# Patient Record
Sex: Female | Born: 1960 | Race: White | Hispanic: No | Marital: Married | Smoking: Never smoker
Health system: Southern US, Community
[De-identification: ages and names within clinical notes are randomized; demographics above are authoritative.]

## PROBLEM LIST (undated history)

## (undated) DIAGNOSIS — R87629 Unspecified abnormal cytological findings in specimens from vagina: Secondary | ICD-10-CM

## (undated) HISTORY — PX: OTHER SURGICAL HISTORY: SHX169

## (undated) HISTORY — DX: Unspecified abnormal cytological findings in specimens from vagina: R87.629

---

## 2004-07-04 ENCOUNTER — Ambulatory Visit: Payer: Self-pay | Admitting: Obstetrics and Gynecology

## 2005-09-10 ENCOUNTER — Ambulatory Visit: Payer: Self-pay | Admitting: Obstetrics and Gynecology

## 2005-09-21 ENCOUNTER — Ambulatory Visit: Payer: Self-pay | Admitting: Obstetrics and Gynecology

## 2006-03-29 ENCOUNTER — Ambulatory Visit: Payer: Self-pay | Admitting: Obstetrics and Gynecology

## 2006-09-13 ENCOUNTER — Ambulatory Visit: Payer: Self-pay | Admitting: Obstetrics and Gynecology

## 2007-12-29 ENCOUNTER — Ambulatory Visit: Payer: Self-pay | Admitting: Obstetrics and Gynecology

## 2009-01-03 ENCOUNTER — Ambulatory Visit: Payer: Self-pay | Admitting: Unknown Physician Specialty

## 2009-01-08 ENCOUNTER — Ambulatory Visit: Payer: Self-pay | Admitting: Unknown Physician Specialty

## 2009-08-10 HISTORY — PX: LAPAROSCOPY: SHX197

## 2009-09-05 ENCOUNTER — Ambulatory Visit: Payer: Self-pay | Admitting: Internal Medicine

## 2010-01-24 ENCOUNTER — Ambulatory Visit: Payer: Self-pay | Admitting: Unknown Physician Specialty

## 2010-06-22 IMAGING — CT CT ABD-PELV W/ CM
1 of 3 series · 12 of 32 positions shown, 18 images · non-contrast
Comparison: none

REASON FOR EXAM: progressive weight loss
COMMENTS:

[Series 2: soft tissue · axial · 0.58mm/px · z∈[-1224,-864]mm · 12 of 86 slices shown, 18 images]
[im 7/86  soft-tissue]
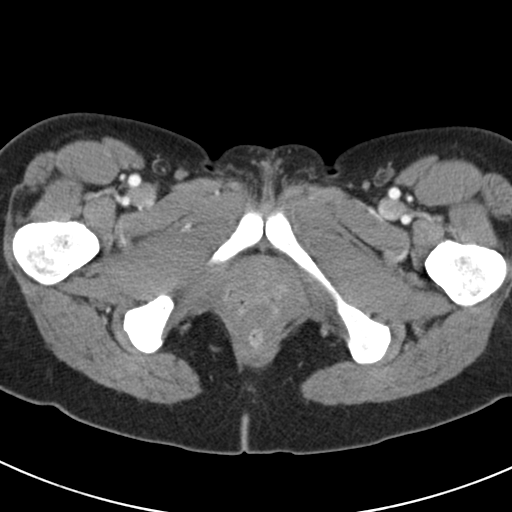
[im 7/86  bone]
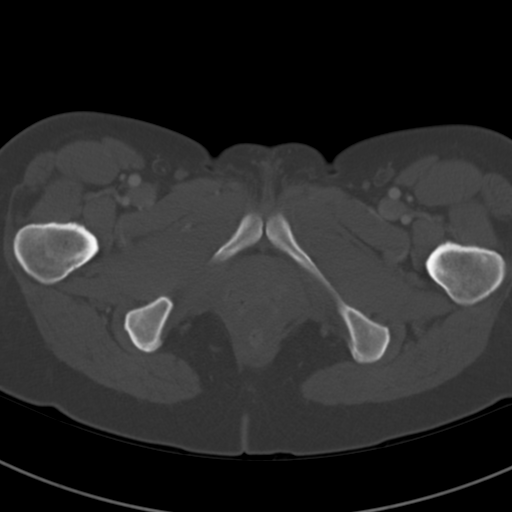
[im 13/86  soft-tissue]
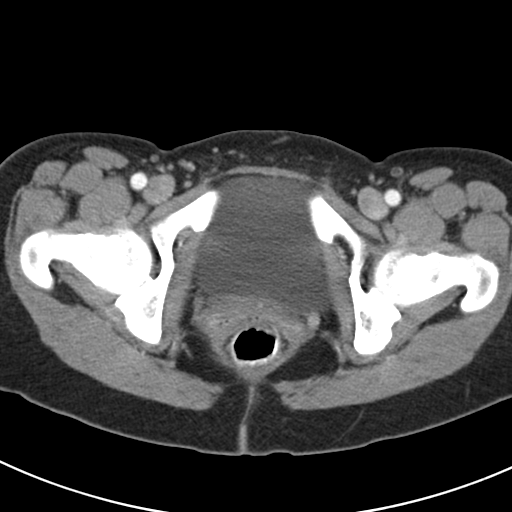
[im 19/86  soft-tissue]
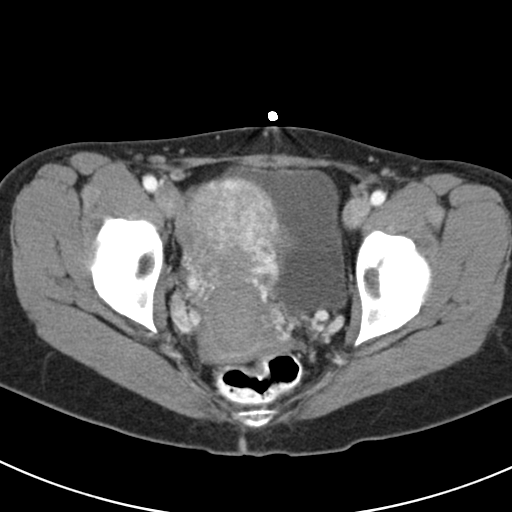
[im 25/86  soft-tissue]
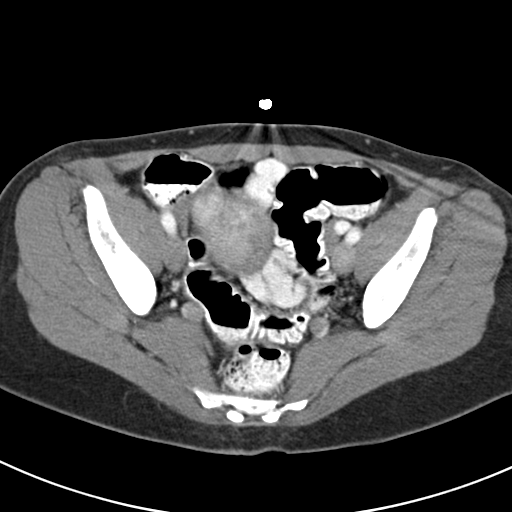
[im 31/86  soft-tissue]
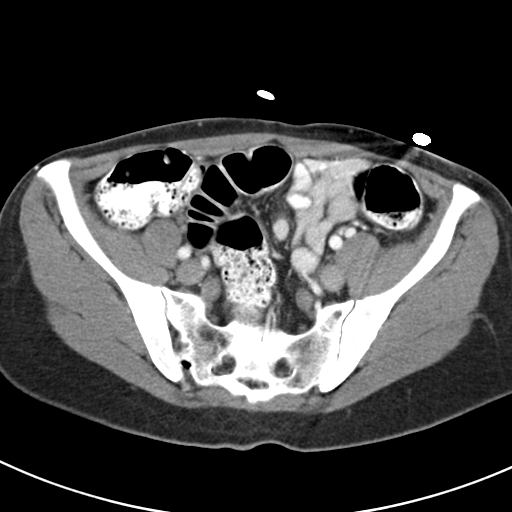
[im 37/86  soft-tissue]
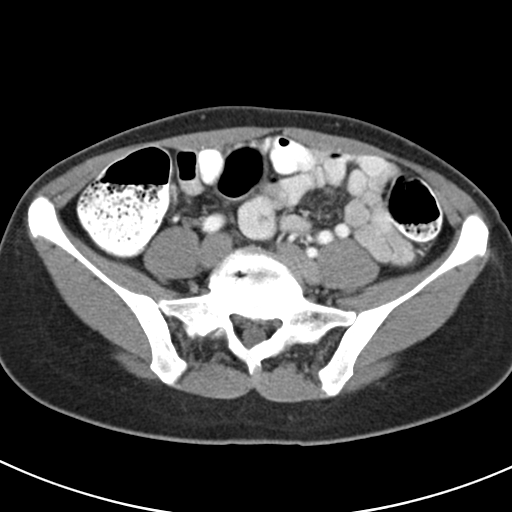
[im 49/86  soft-tissue]
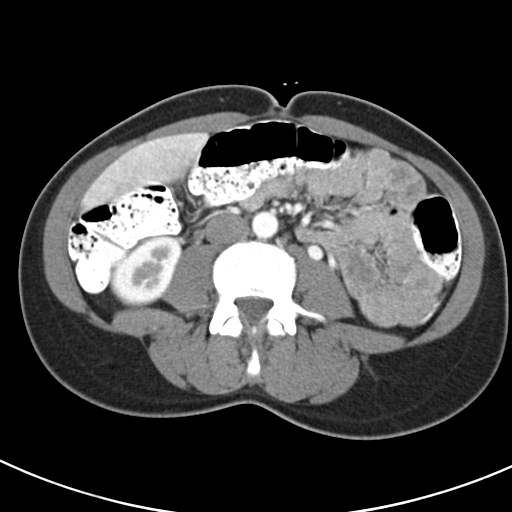
[im 55/86  soft-tissue]
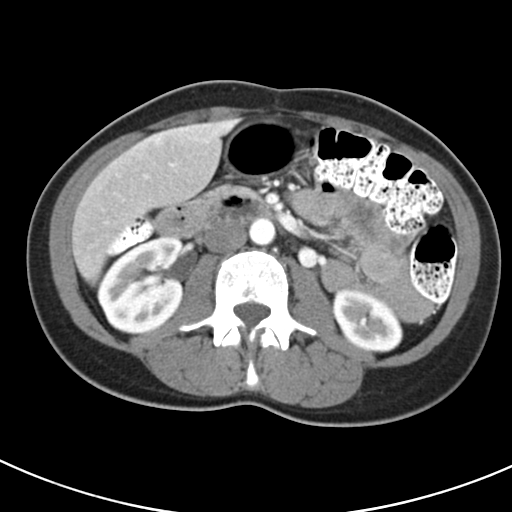
[im 61/86  soft-tissue]
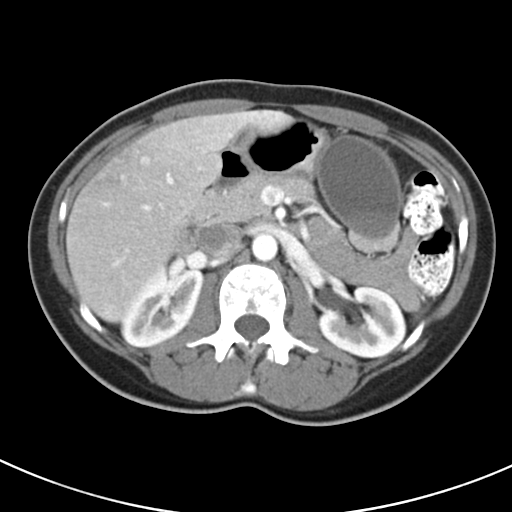
[im 61/86  lung]
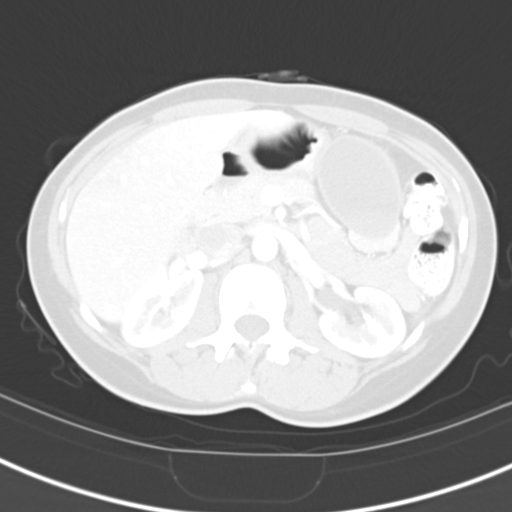
[im 61/86  bone]
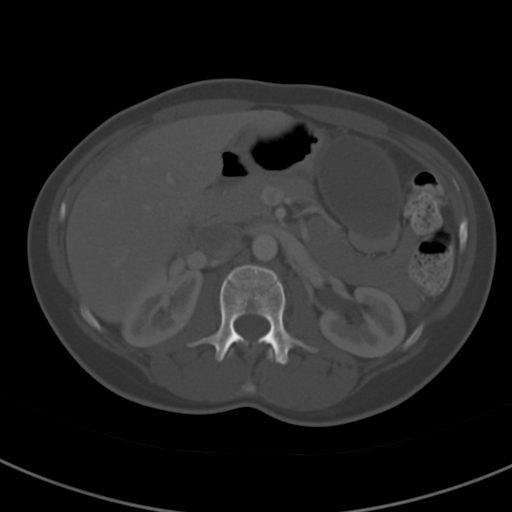
[im 67/86  soft-tissue]
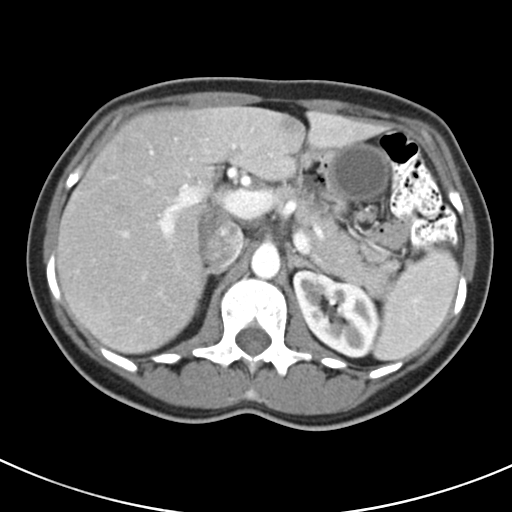
[im 67/86  lung]
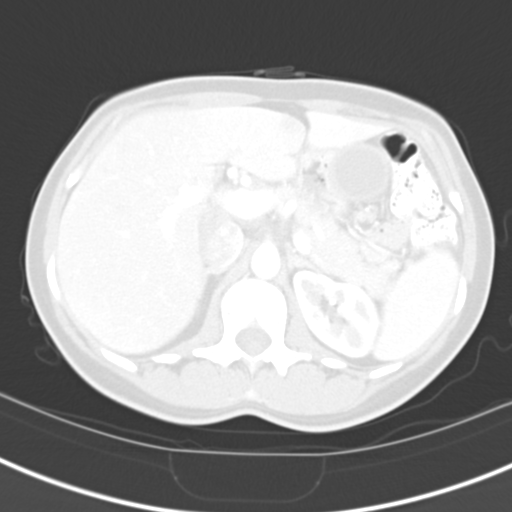
[im 73/86  soft-tissue]
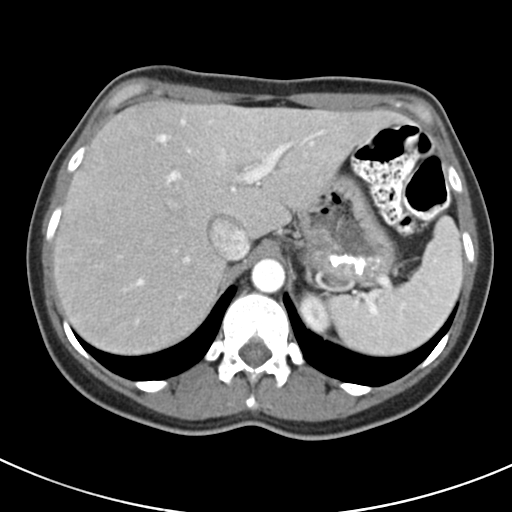
[im 73/86  lung]
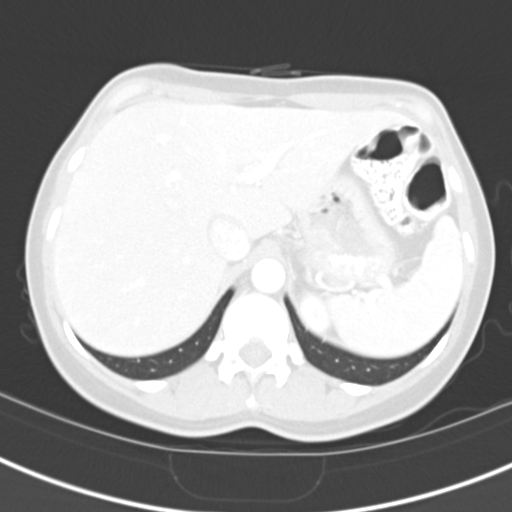
[im 79/86  soft-tissue]
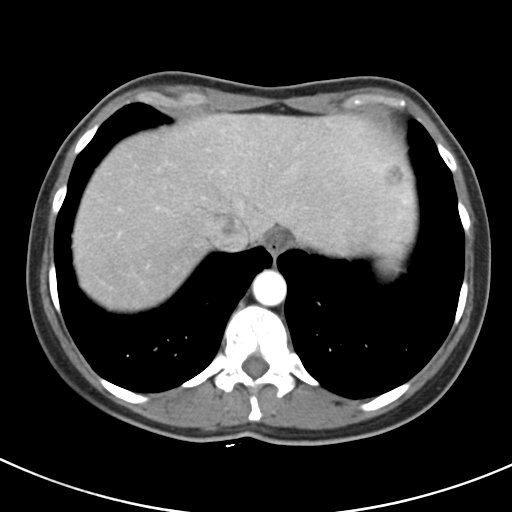
[im 79/86  lung]
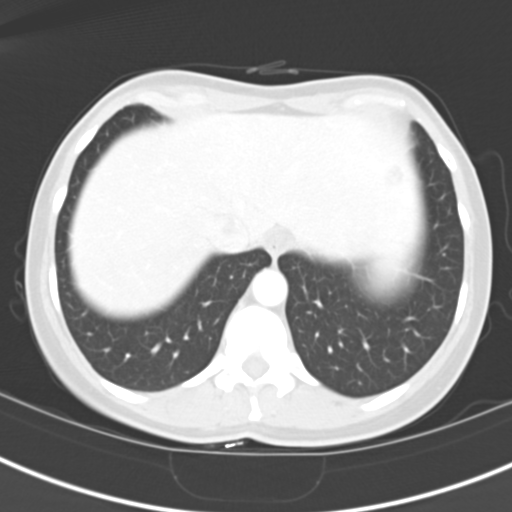

[12 of 32 positions shown; findings below may reference images not displayed]

PROCEDURE:     CT  - CT ABDOMEN / PELVIS  W  - September 05, 2009  [DATE]

RESULT:     CT of the abdomen and pelvis is performed utilizing 100 ml of
Zsovue-MN2 iodinated intravenous contrast along with oral contrast. Images
are reconstructed in the axial plane at 5 mm slice thickness. Axial, coronal
and sagittal thin slice reconstructions were performed utilizing the
[employer] software at the time of dictation. The patient has no previous CT
for comparison.

Images through the base of the lungs demonstrate no definite nodule or mass.
There is no infiltrate, effusion or significant atelectasis. There is a
cystic mass in the left anterior abdomen demonstrated from image #18 to
image #30. This contains some debris and has an enhancing relatively thick
wall with a crescentic shaped enhancing solid tissue component posteriorly.
The cystic mass overall shows a longitudinal measurement of 7.28 cm with a
transverse measurement up to 4.46 cm on image #26. The enhancing soft tissue
crescentic component measures up to 2.86 cm x 0.70 cm on image #26.

There is an area of decreased attenuation and enhancement in the left lobe
of the liver on image #8 an image #9 measuring up to 1.34 cm with a second
similar lesion in the right lobe of the liver best seen on image #12
measuring up to 11 mm. Delayed images were obtained through the liver and
these areas appear to be isodense with the hepatic parenchyma suggesting
that they may be hemangiomas.

There is fullness in the cervical region. Correlate with pelvic exam and Pap
smear. The endometrium appears to be thickened. Pelvic ultrasound is
recommended. There does not appear to be evidence of bowel obstruction,
bowel wall thickening or adenopathy. There is no significant ascites. A
trace amount of fluid is seen in the pelvis. The urinary bladder is
unremarkable. The aorta is normal in caliber. The kidneys enhance
symmetrically and homogeneously without obstruction. The spleen, pancreas
and bony structures appear unremarkable.

The cystic mass with debris and an enhancing wall and possibly an associated
soft tissue component do not appear to be originating from the pancreas even
though they are in close proximity to the pancreatic tail region. A definite
origin from a loop of bowel cannot be established on these images despite
the use of multiplanar reconstructions.
IMPRESSION: 1. Large cystic mass with a solid component in the left anterior to mid
abdomen. Differential considerations are for a cystic gastrointestinal
stromal tumor, cystic mesothelioma, cystic teratoma, cystic lymphangioma,
cystic synovial sarcoma, GI duplication cyst or a pancreatic pseudocyst.
Surgical consultation for excision is suggested.  PET/CT is suggested prior
to any surgical intervention.
2. Lesions within the hepatic parenchyma which are nonspecific. The
appearance suggests these may be secondary to hemangiomas but MRI of the
liver is recommended for further assessment.
3. Fullness of the endometrial lining and of the cervical region. Correlate
with pelvic exam, Pap smear and pelvic ultrasound.

## 2011-03-17 ENCOUNTER — Ambulatory Visit: Payer: Self-pay | Admitting: Unknown Physician Specialty

## 2011-03-23 ENCOUNTER — Ambulatory Visit: Payer: Self-pay | Admitting: Unknown Physician Specialty

## 2011-06-26 ENCOUNTER — Ambulatory Visit: Payer: Self-pay | Admitting: Unknown Physician Specialty

## 2011-06-30 LAB — PATHOLOGY REPORT

## 2011-09-24 ENCOUNTER — Ambulatory Visit: Payer: Self-pay | Admitting: General Surgery

## 2012-05-30 ENCOUNTER — Ambulatory Visit: Payer: Self-pay | Admitting: Obstetrics and Gynecology

## 2013-06-02 ENCOUNTER — Ambulatory Visit: Payer: Self-pay | Admitting: Obstetrics and Gynecology

## 2014-06-05 ENCOUNTER — Ambulatory Visit: Payer: Self-pay | Admitting: Obstetrics and Gynecology

## 2015-03-14 ENCOUNTER — Encounter: Payer: Self-pay | Admitting: *Deleted

## 2015-03-19 ENCOUNTER — Encounter: Payer: Self-pay | Admitting: Obstetrics and Gynecology

## 2015-03-19 ENCOUNTER — Ambulatory Visit (INDEPENDENT_AMBULATORY_CARE_PROVIDER_SITE_OTHER): Payer: PRIVATE HEALTH INSURANCE | Admitting: Obstetrics and Gynecology

## 2015-03-19 VITALS — BP 122/61 | HR 69 | Ht 66.0 in | Wt 129.0 lb

## 2015-03-19 DIAGNOSIS — E559 Vitamin D deficiency, unspecified: Secondary | ICD-10-CM | POA: Diagnosis not present

## 2015-03-19 DIAGNOSIS — Z01419 Encounter for gynecological examination (general) (routine) without abnormal findings: Secondary | ICD-10-CM | POA: Diagnosis not present

## 2015-03-19 NOTE — Progress Notes (Signed)
  Subjective:     Phyllis Barnett is a 54 y.o. female and is here for a comprehensive physical exam. The patient reports improvement of vaginal dryness with olive oil use, but having more difficulty with arousal..  History   Social History  . Marital Status: Married    Spouse Name: N/A  . Number of Children: N/A  . Years of Education: N/A   Occupational History  . Not on file.   Social History Main Topics  . Smoking status: Never Smoker   . Smokeless tobacco: Never Used  . Alcohol Use: Yes  . Drug Use: No  . Sexual Activity: Yes   Other Topics Concern  . Not on file   Social History Narrative   Health Maintenance  Topic Date Due  . Hepatitis C Screening  01-08-1961  . HIV Screening  04/01/1975  . PAP SMEAR  03/31/1978  . TETANUS/TDAP  04/01/1979  . MAMMOGRAM  03/31/2010  . COLONOSCOPY  03/31/2010  . INFLUENZA VACCINE  03/11/2015    The following portions of the patient's history were reviewed and updated as appropriate: allergies, current medications, past family history, past medical history, past social history, past surgical history and problem list.  Review of Systems A comprehensive review of systems was negative except for: Genitourinary: positive for sexual problems Behavioral/Psych: positive for feels like memory is not good   Objective:    General appearance: alert, cooperative and appears stated age Neck: no adenopathy, no carotid bruit, no JVD, supple, symmetrical, trachea midline and thyroid not enlarged, symmetric, no tenderness/mass/nodules Lungs: clear to auscultation bilaterally Breasts: normal appearance, no masses or tenderness Heart: regular rate and rhythm, S1, S2 normal, no murmur, click, rub or gallop Abdomen: soft, non-tender; bowel sounds normal; no masses,  no organomegaly Pelvic: cervix normal in appearance, external genitalia normal, no adnexal masses or tenderness, no cervical motion tenderness, rectovaginal septum normal, uterus  normal size, shape, and consistency and vagina normal without discharge    Assessment:    Healthy female exam. Postmenopausal sexual dysfunction; hypothyroidism; vitamin D deficiency      Plan:  Routine pap and labs obtained, added thyroid and vitamin d and progesterone levels,MMG ordered; will consider adding prometrium for sexual dysfunction if needed.  Will mail copy of labs.  RTC 1 year of prn   See After Visit Summary for Counseling Recommendations

## 2015-03-19 NOTE — Patient Instructions (Signed)
  Place annual gynecologic exam patient instructions here.  Thank you for enrolling in MyChart. Please follow the instructions below to securely access your online medical record. MyChart allows you to send messages to your doctor, view your test results, manage appointments, and more.   How Do I Sign Up? 1. In your Internet browser, go to [employer] and enter [email or web address]. 2. Click on the Sign Up Now link in the Sign In box. You will see the New Member Sign Up page. 3. Enter your MyChart Access Code exactly as it appears below. You will not need to use this code after you've completed the sign-up process. If you do not sign up before the expiration date, you must request a new code.  MyChart Access Code: [email or web address] Expires: 05/18/2015  8:52 AM  4. Enter your Social Security Number ([id number]) and Date of Birth (mm/dd/yyyy) as indicated and click Submit. You will be taken to the next sign-up page. 5. Create a MyChart ID. This will be your MyChart login ID and cannot be changed, so think of one that is secure and easy to remember. 6. Create a MyChart password. You can change your password at any time. 7. Enter your Password Reset Question and Answer. This can be used at a later time if you forget your password.  8. Enter your e-mail address. You will receive e-mail notification when new information is available in MyChart. 9. Click Sign Up. You can now view your medical record.   Additional Information Remember, MyChart is NOT to be used for urgent needs. For medical emergencies, dial 911.

## 2015-03-20 LAB — COMPREHENSIVE METABOLIC PANEL
ALT: 17 IU/L (ref 0–32)
AST: 21 IU/L (ref 0–40)
Albumin/Globulin Ratio: 1.8 (ref 1.1–2.5)
Albumin: 4.6 g/dL (ref 3.5–5.5)
Alkaline Phosphatase: 100 IU/L (ref 39–117)
BILIRUBIN TOTAL: 0.6 mg/dL (ref 0.0–1.2)
BUN/Creatinine Ratio: 13 (ref 9–23)
BUN: 11 mg/dL (ref 6–24)
CO2: 27 mmol/L (ref 18–29)
CREATININE: 0.83 mg/dL (ref 0.57–1.00)
Calcium: 9.7 mg/dL (ref 8.7–10.2)
Chloride: 100 mmol/L (ref 97–108)
GFR, EST AFRICAN AMERICAN: 92 mL/min/{1.73_m2} (ref >59)
GFR, EST NON AFRICAN AMERICAN: 80 mL/min/{1.73_m2} (ref >59)
Globulin, Total: 2.6 g/dL (ref 1.5–4.5)
Glucose: 87 mg/dL (ref 65–99)
Potassium: 4.3 mmol/L (ref 3.5–5.2)
SODIUM: 141 mmol/L (ref 134–144)
Total Protein: 7.2 g/dL (ref 6.0–8.5)

## 2015-03-20 LAB — THYROID PANEL WITH TSH
Free Thyroxine Index: 2.8 (ref 1.2–4.9)
T3 Uptake Ratio: 34 % (ref 24–39)
T4, Total: 8.3 ug/dL (ref 4.5–12.0)
TSH: 0.958 u[IU]/mL (ref 0.450–4.500)

## 2015-03-20 LAB — LIPID PANEL
Chol/HDL Ratio: 2.4 ratio units (ref 0.0–4.4)
Cholesterol, Total: 218 mg/dL — ABNORMAL HIGH (ref 100–199)
HDL: 91 mg/dL (ref >39)
LDL Calculated: 114 mg/dL — ABNORMAL HIGH (ref 0–99)
TRIGLYCERIDES: 63 mg/dL (ref 0–149)
VLDL Cholesterol Cal: 13 mg/dL (ref 5–40)

## 2015-03-20 LAB — PROGESTERONE: Progesterone: 0.2 ng/mL

## 2015-03-20 LAB — VITAMIN D 25 HYDROXY (VIT D DEFICIENCY, FRACTURES): Vit D, 25-Hydroxy: 52.2 ng/mL (ref 30.0–100.0)

## 2015-03-22 ENCOUNTER — Encounter: Payer: Self-pay | Admitting: Obstetrics and Gynecology

## 2015-03-26 ENCOUNTER — Other Ambulatory Visit: Payer: Self-pay | Admitting: Obstetrics and Gynecology

## 2015-03-26 MED ORDER — PROGESTERONE MICRONIZED 200 MG PO CAPS: 200.0000 mg | ORAL_CAPSULE | Freq: Every day | ORAL | Status: DC

## 2015-04-03 ENCOUNTER — Telehealth: Payer: Self-pay | Admitting: *Deleted

## 2015-04-03 NOTE — Telephone Encounter (Signed)
-----   Message from Ulyses Amor, [place] sent at 03/26/2015  3:02 PM EDT ----- Please let her know labs were normal except chol was a little elevated and progesterone levels are still low.  To continue to exercise regular and adhere to low chol diet and would recommend continuing on  progesterone supplements orally 3 weeks of each month.

## 2015-04-03 NOTE — Telephone Encounter (Signed)
Notified pt of results, she voiced understanding.

## 2015-05-14 ENCOUNTER — Telehealth: Payer: Self-pay | Admitting: Obstetrics and Gynecology

## 2015-05-14 NOTE — Telephone Encounter (Signed)
Yes, will still need her to drop off a urine sample

## 2015-05-14 NOTE — Telephone Encounter (Signed)
Pt needs to drop off ua correct???

## 2015-05-14 NOTE — Telephone Encounter (Signed)
Patient is sure she has a uti. She would like to know if Melody could just call something in for her.She uses [facility].Thanks

## 2015-05-15 ENCOUNTER — Other Ambulatory Visit (INDEPENDENT_AMBULATORY_CARE_PROVIDER_SITE_OTHER): Payer: PRIVATE HEALTH INSURANCE

## 2015-05-15 VITALS — BP 103/68 | HR 68 | Temp 98.3°F | Ht 66.0 in | Wt 128.4 lb

## 2015-05-15 DIAGNOSIS — N390 Urinary tract infection, site not specified: Secondary | ICD-10-CM | POA: Diagnosis not present

## 2015-05-15 LAB — POCT URINALYSIS DIPSTICK
BILIRUBIN UA: NEGATIVE
Glucose, UA: NEGATIVE
KETONES UA: NEGATIVE
Nitrite, UA: NEGATIVE
PH UA: 6
SPEC GRAV UA: 1.015
Urobilinogen, UA: NEGATIVE

## 2015-05-15 MED ORDER — NITROFURANTOIN MONOHYD MACRO 100 MG PO CAPS: 100.0000 mg | ORAL_CAPSULE | Freq: Two times a day (BID) | ORAL | Status: DC

## 2015-05-15 NOTE — Telephone Encounter (Signed)
Left message pt will need to drop off ua

## 2015-05-15 NOTE — Progress Notes (Signed)
Patient ID: Phyllis Barnett, female   DOB: February 05, 1961, 54 y.o.   MRN: [id number] Pt presents with UTI sx such as frequency, pain, chills. Took Azo last night and drank lots of water with some relief. Pt's urinalysis was abnormal here in the office  Urine culture sent to lab. MNB advised of urinalysis results. To send in rx for Macrobid  bid, x7days, and urogesic 4x day. Pt informed urine culture may be back Friday or Monday. To contact office Monday afternoon if she has not heard results.

## 2015-05-17 ENCOUNTER — Other Ambulatory Visit: Payer: Self-pay | Admitting: Obstetrics and Gynecology

## 2015-05-17 ENCOUNTER — Telehealth: Payer: Self-pay | Admitting: *Deleted

## 2015-05-17 LAB — URINE CULTURE

## 2015-05-17 NOTE — Telephone Encounter (Signed)
Left detailed message for pt 

## 2015-05-17 NOTE — Telephone Encounter (Signed)
-----   Message from Ulyses Amor, [place] sent at 05/17/2015  4:48 PM EDT ----- Please let her know urine culture was positive for e Coli and current antibiotic should work, make sure she is feeling better

## 2015-05-24 ENCOUNTER — Other Ambulatory Visit: Payer: Self-pay | Admitting: *Deleted

## 2015-05-24 MED ORDER — FLUCONAZOLE 100 MG PO TABS: 100.0000 mg | ORAL_TABLET | Freq: Every day | ORAL | Status: DC

## 2015-11-18 ENCOUNTER — Ambulatory Visit
Admission: RE | Admit: 2015-11-18 | Discharge: 2015-11-18 | Disposition: A | Discharge status: Home / Self Care | Payer: PRIVATE HEALTH INSURANCE | Source: Ambulatory Visit | Attending: Obstetrics and Gynecology | Admitting: Obstetrics and Gynecology

## 2015-11-18 DIAGNOSIS — Z1231 Encounter for screening mammogram for malignant neoplasm of breast: Secondary | ICD-10-CM | POA: Insufficient documentation

## 2015-11-18 DIAGNOSIS — Z01419 Encounter for gynecological examination (general) (routine) without abnormal findings: Secondary | ICD-10-CM

## 2016-02-25 ENCOUNTER — Encounter: Payer: Self-pay | Admitting: Obstetrics and Gynecology

## 2016-03-20 ENCOUNTER — Encounter: Payer: PRIVATE HEALTH INSURANCE | Admitting: Obstetrics and Gynecology

## 2016-04-28 ENCOUNTER — Other Ambulatory Visit: Payer: Self-pay | Admitting: *Deleted

## 2016-04-28 MED ORDER — PROGESTERONE MICRONIZED 200 MG PO CAPS: 200.0000 mg | ORAL_CAPSULE | Freq: Every day | ORAL | 4 refills | Status: DC

## 2016-05-04 ENCOUNTER — Other Ambulatory Visit: Payer: Self-pay | Admitting: *Deleted

## 2016-05-04 MED ORDER — FLUCONAZOLE 100 MG PO TABS: 100.0000 mg | ORAL_TABLET | Freq: Every day | ORAL | 1 refills | Status: DC

## 2016-05-14 ENCOUNTER — Encounter: Payer: Self-pay | Admitting: Obstetrics and Gynecology

## 2016-05-14 ENCOUNTER — Ambulatory Visit (INDEPENDENT_AMBULATORY_CARE_PROVIDER_SITE_OTHER): Payer: PRIVATE HEALTH INSURANCE | Admitting: Obstetrics and Gynecology

## 2016-05-14 VITALS — BP 117/75 | HR 75 | Ht 66.0 in | Wt 131.4 lb

## 2016-05-14 DIAGNOSIS — Z01419 Encounter for gynecological examination (general) (routine) without abnormal findings: Secondary | ICD-10-CM

## 2016-05-14 NOTE — Patient Instructions (Signed)
Preventive Care for Adults, Female A healthy lifestyle and preventive care can promote health and wellness. Preventive health guidelines for women include the following key practices.  A routine yearly physical is a good way to check with your health care provider about your health and preventive screening. It is a chance to share any concerns and updates on your health and to receive a thorough exam.  Visit your dentist for a routine exam and preventive care every 6 months. Brush your teeth twice a day and floss once a day. Good oral hygiene prevents tooth decay and gum disease.  The frequency of eye exams is based on your age, health, family medical history, use of contact lenses, and other factors. Follow your health care provider's recommendations for frequency of eye exams.  Eat a healthy diet. Foods like vegetables, fruits, whole grains, low-fat dairy products, and lean protein foods contain the nutrients you need without too many calories. Decrease your intake of foods high in solid fats, added sugars, and salt. Eat the right amount of calories for you.Get information about a proper diet from your health care provider, if necessary.  Regular physical exercise is one of the most important things you can do for your health. Most adults should get at least 150 minutes of moderate-intensity exercise (any activity that increases your heart rate and causes you to sweat) each week. In addition, most adults need muscle-strengthening exercises on 2 or more days a week.  Maintain a healthy weight. The body mass index (BMI) is a screening tool to identify possible weight problems. It provides an estimate of body fat based on height and weight. Your health care provider can find your BMI and can help you achieve or maintain a healthy weight.For adults 20 years and older:  A BMI below 18.5 is considered underweight.  A BMI of 18.5 to 24.9 is normal.  A BMI of 25 to 29.9 is considered  overweight.  A BMI of 30 and above is considered obese.  Maintain normal blood lipids and cholesterol levels by exercising and minimizing your intake of saturated fat. Eat a balanced diet with plenty of fruit and vegetables. Blood tests for lipids and cholesterol should begin at age 64 and be repeated every 5 years. If your lipid or cholesterol levels are high, you are over 50, or you are at high risk for heart disease, you may need your cholesterol levels checked more frequently.Ongoing high lipid and cholesterol levels should be treated with medicines if diet and exercise are not working.  If you smoke, find out from your health care provider how to quit. If you do not use tobacco, do not start.  Lung cancer screening is recommended for adults aged 52-80 years who are at high risk for developing lung cancer because of a history of smoking. A yearly low-dose CT scan of the lungs is recommended for people who have at least a 30-pack-year history of smoking and are a current smoker or have quit within the past 15 years. A pack year of smoking is smoking an average of 1 pack of cigarettes a day for 1 year (for example: 1 pack a day for 30 years or 2 packs a day for 15 years). Yearly screening should continue until the smoker has stopped smoking for at least 15 years. Yearly screening should be stopped for people who develop a health problem that would prevent them from having lung cancer treatment.  If you are pregnant, do not drink alcohol. If you are  breastfeeding, be very cautious about drinking alcohol. If you are not pregnant and choose to drink alcohol, do not have more than 1 drink per day. One drink is considered to be 12 ounces (355 mL) of beer, 5 ounces (148 mL) of wine, or 1.5 ounces (44 mL) of liquor.  Avoid use of street drugs. Do not share needles with anyone. Ask for help if you need support or instructions about stopping the use of drugs.  High blood pressure causes heart disease and  increases the risk of stroke. Your blood pressure should be checked at least every 1 to 2 years. Ongoing high blood pressure should be treated with medicines if weight loss and exercise do not work.  If you are 25-78 years old, ask your health care provider if you should take aspirin to prevent strokes.  Diabetes screening is done by taking a blood sample to check your blood glucose level after you have not eaten for a certain period of time (fasting). If you are not overweight and you do not have risk factors for diabetes, you should be screened once every 3 years starting at age 86. If you are overweight or obese and you are 3-87 years of age, you should be screened for diabetes every year as part of your cardiovascular risk assessment.  Breast cancer screening is essential preventive care for women. You should practice "breast self-awareness." This means understanding the normal appearance and feel of your breasts and may include breast self-examination. Any changes detected, no matter how small, should be reported to a health care provider. Women in their 66s and 30s should have a clinical breast exam (CBE) by a health care provider as part of a regular health exam every 1 to 3 years. After age 43, women should have a CBE every year. Starting at age 37, women should consider having a mammogram (breast X-ray test) every year. Women who have a family history of breast cancer should talk to their health care provider about genetic screening. Women at a high risk of breast cancer should talk to their health care providers about having an MRI and a mammogram every year.  Breast cancer gene (BRCA)-related cancer risk assessment is recommended for women who have family members with BRCA-related cancers. BRCA-related cancers include breast, ovarian, tubal, and peritoneal cancers. Having family members with these cancers may be associated with an increased risk for harmful changes (mutations) in the breast  cancer genes BRCA1 and BRCA2. Results of the assessment will determine the need for genetic counseling and BRCA1 and BRCA2 testing.  Your health care provider may recommend that you be screened regularly for cancer of the pelvic organs (ovaries, uterus, and vagina). This screening involves a pelvic examination, including checking for microscopic changes to the surface of your cervix (Pap test). You may be encouraged to have this screening done every 3 years, beginning at age 78.  For women ages 79-65, health care providers may recommend pelvic exams and Pap testing every 3 years, or they may recommend the Pap and pelvic exam, combined with testing for human papilloma virus (HPV), every 5 years. Some types of HPV increase your risk of cervical cancer. Testing for HPV may also be done on women of any age with unclear Pap test results.  Other health care providers may not recommend any screening for nonpregnant women who are considered low risk for pelvic cancer and who do not have symptoms. Ask your health care provider if a screening pelvic exam is right for  you.  If you have had past treatment for cervical cancer or a condition that could lead to cancer, you need Pap tests and screening for cancer for at least 20 years after your treatment. If Pap tests have been discontinued, your risk factors (such as having a new sexual partner) need to be reassessed to determine if screening should resume. Some women have medical problems that increase the chance of getting cervical cancer. In these cases, your health care provider may recommend more frequent screening and Pap tests.  Colorectal cancer can be detected and often prevented. Most routine colorectal cancer screening begins at the age of 50 years and continues through age 75 years. However, your health care provider may recommend screening at an earlier age if you have risk factors for colon cancer. On a yearly basis, your health care provider may provide  home test kits to check for hidden blood in the stool. Use of a small camera at the end of a tube, to directly examine the colon (sigmoidoscopy or colonoscopy), can detect the earliest forms of colorectal cancer. Talk to your health care provider about this at age 50, when routine screening begins. Direct exam of the colon should be repeated every 5-10 years through age 75 years, unless early forms of precancerous polyps or small growths are found.  People who are at an increased risk for hepatitis B should be screened for this virus. You are considered at high risk for hepatitis B if:  You were born in a country where hepatitis B occurs often. Talk with your health care provider about which countries are considered high risk.  Your parents were born in a high-risk country and you have not received a shot to protect against hepatitis B (hepatitis B vaccine).  You have HIV or AIDS.  You use needles to inject street drugs.  You live with, or have sex with, someone who has hepatitis B.  You get hemodialysis treatment.  You take certain medicines for conditions like cancer, organ transplantation, and autoimmune conditions.  Hepatitis C blood testing is recommended for all people born from 1945 through 1965 and any individual with known risks for hepatitis C.  Practice safe sex. Use condoms and avoid high-risk sexual practices to reduce the spread of sexually transmitted infections (STIs). STIs include gonorrhea, chlamydia, syphilis, trichomonas, herpes, HPV, and human immunodeficiency virus (HIV). Herpes, HIV, and HPV are viral illnesses that have no cure. They can result in disability, cancer, and death.  You should be screened for sexually transmitted illnesses (STIs) including gonorrhea and chlamydia if:  You are sexually active and are younger than 24 years.  You are older than 24 years and your health care provider tells you that you are at risk for this type of infection.  Your sexual  activity has changed since you were last screened and you are at an increased risk for chlamydia or gonorrhea. Ask your health care provider if you are at risk.  If you are at risk of being infected with HIV, it is recommended that you take a prescription medicine daily to prevent HIV infection. This is called preexposure prophylaxis (PrEP). You are considered at risk if:  You are sexually active and do not regularly use condoms or know the HIV status of your partner(s).  You take drugs by injection.  You are sexually active with a partner who has HIV.  Talk with your health care provider about whether you are at high risk of being infected with HIV. If   you choose to begin PrEP, you should first be tested for HIV. You should then be tested every 3 months for as long as you are taking PrEP.  Osteoporosis is a disease in which the bones lose minerals and strength with aging. This can result in serious bone fractures or breaks. The risk of osteoporosis can be identified using a bone density scan. Women ages 1 years and over and women at risk for fractures or osteoporosis should discuss screening with their health care providers. Ask your health care provider whether you should take a calcium supplement or vitamin D to reduce the rate of osteoporosis.  Menopause can be associated with physical symptoms and risks. Hormone replacement therapy is available to decrease symptoms and risks. You should talk to your health care provider about whether hormone replacement therapy is right for you.  Use sunscreen. Apply sunscreen liberally and repeatedly throughout the day. You should seek shade when your shadow is shorter than you. Protect yourself by wearing long sleeves, pants, a wide-brimmed hat, and sunglasses year round, whenever you are outdoors.  Once a month, do a whole body skin exam, using a mirror to look at the skin on your back. Tell your health care provider of new moles, moles that have irregular  borders, moles that are larger than a pencil eraser, or moles that have changed in shape or color.  Stay current with required vaccines (immunizations).  Influenza vaccine. All adults should be immunized every year.  Tetanus, diphtheria, and acellular pertussis (Td, Tdap) vaccine. Pregnant women should receive 1 dose of Tdap vaccine during each pregnancy. The dose should be obtained regardless of the length of time since the last dose. Immunization is preferred during the 27th-36th week of gestation. An adult who has not previously received Tdap or who does not know her vaccine status should receive 1 dose of Tdap. This initial dose should be followed by tetanus and diphtheria toxoids (Td) booster doses every 10 years. Adults with an unknown or incomplete history of completing a 3-dose immunization series with Td-containing vaccines should begin or complete a primary immunization series including a Tdap dose. Adults should receive a Td booster every 10 years.  Varicella vaccine. An adult without evidence of immunity to varicella should receive 2 doses or a second dose if she has previously received 1 dose. Pregnant females who do not have evidence of immunity should receive the first dose after pregnancy. This first dose should be obtained before leaving the health care facility. The second dose should be obtained 4-8 weeks after the first dose.  Human papillomavirus (HPV) vaccine. Females aged 13-26 years who have not received the vaccine previously should obtain the 3-dose series. The vaccine is not recommended for use in pregnant females. However, pregnancy testing is not needed before receiving a dose. If a female is found to be pregnant after receiving a dose, no treatment is needed. In that case, the remaining doses should be delayed until after the pregnancy. Immunization is recommended for any person with an immunocompromised condition through the age of 24 years if she did not get any or all doses  earlier. During the 3-dose series, the second dose should be obtained 4-8 weeks after the first dose. The third dose should be obtained 24 weeks after the first dose and 16 weeks after the second dose.  Zoster vaccine. One dose is recommended for adults aged 97 years or older unless certain conditions are present.  Measles, mumps, and rubella (MMR) vaccine. Adults born  before 1957 generally are considered immune to measles and mumps. Adults born in [birth year] or later should have 1 or more doses of MMR vaccine unless there is a contraindication to the vaccine or there is laboratory evidence of immunity to each of the three diseases. A routine second dose of MMR vaccine should be obtained at least 28 days after the first dose for students attending postsecondary schools, health care workers, or international travelers. People who received inactivated measles vaccine or an unknown type of measles vaccine during 1963-1967 should receive 2 doses of MMR vaccine. People who received inactivated mumps vaccine or an unknown type of mumps vaccine before 1979 and are at high risk for mumps infection should consider immunization with 2 doses of MMR vaccine. For females of childbearing age, rubella immunity should be determined. If there is no evidence of immunity, females who are not pregnant should be vaccinated. If there is no evidence of immunity, females who are pregnant should delay immunization until after pregnancy. Unvaccinated health care workers born before 60 who lack laboratory evidence of measles, mumps, or rubella immunity or laboratory confirmation of disease should consider measles and mumps immunization with 2 doses of MMR vaccine or rubella immunization with 1 dose of MMR vaccine.  Pneumococcal 13-valent conjugate (PCV13) vaccine. When indicated, a person who is uncertain of his immunization history and has no record of immunization should receive the PCV13 vaccine. All adults 61 years of age and older  should receive this vaccine. An adult aged 92 years or older who has certain medical conditions and has not been previously immunized should receive 1 dose of PCV13 vaccine. This PCV13 should be followed with a dose of pneumococcal polysaccharide (PPSV23) vaccine. Adults who are at high risk for pneumococcal disease should obtain the PPSV23 vaccine at least 8 weeks after the dose of PCV13 vaccine. Adults older than 56 years of age who have normal immune system function should obtain the PPSV23 vaccine dose at least 1 year after the dose of PCV13 vaccine.  Pneumococcal polysaccharide (PPSV23) vaccine. When PCV13 is also indicated, PCV13 should be obtained first. All adults aged 2 years and older should be immunized. An adult younger than age 30 years who has certain medical conditions should be immunized. Any person who resides in a nursing home or long-term care facility should be immunized. An adult smoker should be immunized. People with an immunocompromised condition and certain other conditions should receive both PCV13 and PPSV23 vaccines. People with human immunodeficiency virus (HIV) infection should be immunized as soon as possible after diagnosis. Immunization during chemotherapy or radiation therapy should be avoided. Routine use of PPSV23 vaccine is not recommended for American Indians, [place] Natives, or people younger than 65 years unless there are medical conditions that require PPSV23 vaccine. When indicated, people who have unknown immunization and have no record of immunization should receive PPSV23 vaccine. One-time revaccination 5 years after the first dose of PPSV23 is recommended for people aged 19-64 years who have chronic kidney failure, nephrotic syndrome, asplenia, or immunocompromised conditions. People who received 1-2 doses of PPSV23 before age 44 years should receive another dose of PPSV23 vaccine at age 83 years or later if at least 5 years have passed since the previous dose. Doses  of PPSV23 are not needed for people immunized with PPSV23 at or after age 20 years.  Meningococcal vaccine. Adults with asplenia or persistent complement component deficiencies should receive 2 doses of quadrivalent meningococcal conjugate (MenACWY-D) vaccine. The doses should be obtained  at least 2 months apart. Microbiologists working with certain meningococcal bacteria, [place] recruits, people at risk during an outbreak, and people who travel to or live in countries with a high rate of meningitis should be immunized. A first-year college student up through age 28 years who is living in a residence hall should receive a dose if she did not receive a dose on or after her 16th birthday. Adults who have certain high-risk conditions should receive one or more doses of vaccine.  Hepatitis A vaccine. Adults who wish to be protected from this disease, have certain high-risk conditions, work with hepatitis A-infected animals, work in hepatitis A research labs, or travel to or work in countries with a high rate of hepatitis A should be immunized. Adults who were previously unvaccinated and who anticipate close contact with an international adoptee during the first 60 days after arrival in the [place] from a country with a high rate of hepatitis A should be immunized.  Hepatitis B vaccine. Adults who wish to be protected from this disease, have certain high-risk conditions, may be exposed to blood or other infectious body fluids, are household contacts or sex partners of hepatitis B positive people, are clients or workers in certain care facilities, or travel to or work in countries with a high rate of hepatitis B should be immunized.  Haemophilus influenzae type b (Hib) vaccine. A previously unvaccinated person with asplenia or sickle cell disease or having a scheduled splenectomy should receive 1 dose of Hib vaccine. Regardless of previous immunization, a recipient of a hematopoietic stem cell transplant  should receive a 3-dose series 6-12 months after her successful transplant. Hib vaccine is not recommended for adults with HIV infection. Preventive Services / Frequency Ages 71 to 87 years  Blood pressure check.** / Every 3-5 years.  Lipid and cholesterol check.** / Every 5 years beginning at age 1.  Clinical breast exam.** / Every 3 years for women in their 3s and 31s.  BRCA-related cancer risk assessment.** / For women who have family members with a BRCA-related cancer (breast, ovarian, tubal, or peritoneal cancers).  Pap test.** / Every 2 years from ages 50 through 86. Every 3 years starting at age 87 through age 7 or 75 with a history of 3 consecutive normal Pap tests.  HPV screening.** / Every 3 years from ages 59 through ages 35 to 6 with a history of 3 consecutive normal Pap tests.  Hepatitis C blood test.** / For any individual with known risks for hepatitis C.  Skin self-exam. / Monthly.  Influenza vaccine. / Every year.  Tetanus, diphtheria, and acellular pertussis (Tdap, Td) vaccine.** / Consult your health care provider. Pregnant women should receive 1 dose of Tdap vaccine during each pregnancy. 1 dose of Td every 10 years.  Varicella vaccine.** / Consult your health care provider. Pregnant females who do not have evidence of immunity should receive the first dose after pregnancy.  HPV vaccine. / 3 doses over 6 months, if 72 and younger. The vaccine is not recommended for use in pregnant females. However, pregnancy testing is not needed before receiving a dose.  Measles, mumps, rubella (MMR) vaccine.** / You need at least 1 dose of MMR if you were born in 1957 or later. You may also need a 2nd dose. For females of childbearing age, rubella immunity should be determined. If there is no evidence of immunity, females who are not pregnant should be vaccinated. If there is no evidence of immunity, females who are  pregnant should delay immunization until after  pregnancy.  Pneumococcal 13-valent conjugate (PCV13) vaccine.** / Consult your health care provider.  Pneumococcal polysaccharide (PPSV23) vaccine.** / 1 to 2 doses if you smoke cigarettes or if you have certain conditions.  Meningococcal vaccine.** / 1 dose if you are age 87 to 44 years and a Market researcher living in a residence hall, or have one of several medical conditions, you need to get vaccinated against meningococcal disease. You may also need additional booster doses.  Hepatitis A vaccine.** / Consult your health care provider.  Hepatitis B vaccine.** / Consult your health care provider.  Haemophilus influenzae type b (Hib) vaccine.** / Consult your health care provider. Ages 86 to 38 years  Blood pressure check.** / Every year.  Lipid and cholesterol check.** / Every 5 years beginning at age 49 years.  Lung cancer screening. / Every year if you are aged 71-80 years and have a 30-pack-year history of smoking and currently smoke or have quit within the past 15 years. Yearly screening is stopped once you have quit smoking for at least 15 years or develop a health problem that would prevent you from having lung cancer treatment.  Clinical breast exam.** / Every year after age 51 years.  BRCA-related cancer risk assessment.** / For women who have family members with a BRCA-related cancer (breast, ovarian, tubal, or peritoneal cancers).  Mammogram.** / Every year beginning at age 18 years and continuing for as long as you are in good health. Consult with your health care provider.  Pap test.** / Every 3 years starting at age 63 years through age 37 or 57 years with a history of 3 consecutive normal Pap tests.  HPV screening.** / Every 3 years from ages 41 years through ages 76 to 23 years with a history of 3 consecutive normal Pap tests.  Fecal occult blood test (FOBT) of stool. / Every year beginning at age 36 years and continuing until age 51 years. You may not need  to do this test if you get a colonoscopy every 10 years.  Flexible sigmoidoscopy or colonoscopy.** / Every 5 years for a flexible sigmoidoscopy or every 10 years for a colonoscopy beginning at age 36 years and continuing until age 35 years.  Hepatitis C blood test.** / For all people born from [birth year] through 1965 and any individual with known risks for hepatitis C.  Skin self-exam. / Monthly.  Influenza vaccine. / Every year.  Tetanus, diphtheria, and acellular pertussis (Tdap/Td) vaccine.** / Consult your health care provider. Pregnant women should receive 1 dose of Tdap vaccine during each pregnancy. 1 dose of Td every 10 years.  Varicella vaccine.** / Consult your health care provider. Pregnant females who do not have evidence of immunity should receive the first dose after pregnancy.  Zoster vaccine.** / 1 dose for adults aged 73 years or older.  Measles, mumps, rubella (MMR) vaccine.** / You need at least 1 dose of MMR if you were born in 1957 or later. You may also need a second dose. For females of childbearing age, rubella immunity should be determined. If there is no evidence of immunity, females who are not pregnant should be vaccinated. If there is no evidence of immunity, females who are pregnant should delay immunization until after pregnancy.  Pneumococcal 13-valent conjugate (PCV13) vaccine.** / Consult your health care provider.  Pneumococcal polysaccharide (PPSV23) vaccine.** / 1 to 2 doses if you smoke cigarettes or if you have certain conditions.  Meningococcal vaccine.** /  Consult your health care provider.  Hepatitis A vaccine.** / Consult your health care provider.  Hepatitis B vaccine.** / Consult your health care provider.  Haemophilus influenzae type b (Hib) vaccine.** / Consult your health care provider. Ages 80 years and over  Blood pressure check.** / Every year.  Lipid and cholesterol check.** / Every 5 years beginning at age 62 years.  Lung cancer  screening. / Every year if you are aged 32-80 years and have a 30-pack-year history of smoking and currently smoke or have quit within the past 15 years. Yearly screening is stopped once you have quit smoking for at least 15 years or develop a health problem that would prevent you from having lung cancer treatment.  Clinical breast exam.** / Every year after age 61 years.  BRCA-related cancer risk assessment.** / For women who have family members with a BRCA-related cancer (breast, ovarian, tubal, or peritoneal cancers).  Mammogram.** / Every year beginning at age 39 years and continuing for as long as you are in good health. Consult with your health care provider.  Pap test.** / Every 3 years starting at age 85 years through age 74 or 72 years with 3 consecutive normal Pap tests. Testing can be stopped between 65 and 70 years with 3 consecutive normal Pap tests and no abnormal Pap or HPV tests in the past 10 years.  HPV screening.** / Every 3 years from ages 55 years through ages 67 or 77 years with a history of 3 consecutive normal Pap tests. Testing can be stopped between 65 and 70 years with 3 consecutive normal Pap tests and no abnormal Pap or HPV tests in the past 10 years.  Fecal occult blood test (FOBT) of stool. / Every year beginning at age 81 years and continuing until age 22 years. You may not need to do this test if you get a colonoscopy every 10 years.  Flexible sigmoidoscopy or colonoscopy.** / Every 5 years for a flexible sigmoidoscopy or every 10 years for a colonoscopy beginning at age 67 years and continuing until age 22 years.  Hepatitis C blood test.** / For all people born from [birth year] through 1966 and any individual with known risks for hepatitis C.  Osteoporosis screening.** / A one-time screening for women ages 8 years and over and women at risk for fractures or osteoporosis.  Skin self-exam. / Monthly.  Influenza vaccine. / Every year.  Tetanus, diphtheria, and  acellular pertussis (Tdap/Td) vaccine.** / 1 dose of Td every 10 years.  Varicella vaccine.** / Consult your health care provider.  Zoster vaccine.** / 1 dose for adults aged 56 years or older.  Pneumococcal 13-valent conjugate (PCV13) vaccine.** / Consult your health care provider.  Pneumococcal polysaccharide (PPSV23) vaccine.** / 1 dose for all adults aged 15 years and older.  Meningococcal vaccine.** / Consult your health care provider.  Hepatitis A vaccine.** / Consult your health care provider.  Hepatitis B vaccine.** / Consult your health care provider.  Haemophilus influenzae type b (Hib) vaccine.** / Consult your health care provider. ** Family history and personal history of risk and conditions may change your health care provider's recommendations.   This information is not intended to replace advice given to you by your health care provider. Make sure you discuss any questions you have with your health care provider.   Document Released: 09/22/2001 Document Revised: 08/17/2014 Document Reviewed: 12/22/2010 Elsevier Interactive Patient Education [employer].

## 2016-05-14 NOTE — Progress Notes (Signed)
Subjective:   Phyllis Barnett is a 55 y.o. 803P1003 Caucasian female here for a routine well-woman exam.  No LMP recorded. Patient is postmenopausal.    Current complaints: vaginal dryness helped with olive oil PCP: Bethann PunchesMark Miller       Doesn't need labs  Social History: Sexual: heterosexual Marital Status: married Living situation: with spouse Occupation: Runner, broadcasting/film/videoteacher at [employer] Tobacco/alcohol: no tobacco use Illicit drugs: no history of illicit drug use  The following portions of the patient's history were reviewed and updated as appropriate: allergies, current medications, past family history, past medical history, past social history, past surgical history and problem list.  Past Medical History Past Medical History:  Diagnosis Date  . Vaginal Pap smear, abnormal     Past Surgical History Past Surgical History:  Procedure Laterality Date  . LAPAROSCOPY  2011   benign tumor removed    Gynecologic History G3P1003  No LMP recorded. Patient is postmenopausal. Contraception: post menopausal status Last Pap: 2016. Results were: normal Last mammogram: 2017. Results were: normal  Obstetric History OB History  Gravida Para Term Preterm AB Living  3 1 1     3   SAB TAB Ectopic Multiple Live Births          3    # Outcome Date GA Lbr Len/2nd Weight Sex Delivery Anes PTL Lv  3 Gravida 1996    M Vag-Spont   LIV  2 Gravida 1992    M Vag-Spont   LIV  1 Term 1990    F Vag-Spont   LIV      Current Medications Current Outpatient Prescriptions on File Prior to Visit  Medication Sig Dispense Refill  . cholecalciferol (VITAMIN D) 1000 UNITS tablet Take 1,000 Units by mouth daily.    Marland Kitchen. levothyroxine (SYNTHROID, LEVOTHROID) 75 MCG tablet Take 75 mcg by mouth daily before breakfast.    . nitrofurantoin, macrocrystal-monohydrate, (MACROBID) 100 MG capsule Take 1 capsule (100 mg total) by mouth 2 (two) times daily. 14 capsule 0  . progesterone (PROMETRIUM) 200 MG capsule Take 1 capsule (200  mg total) by mouth daily. 30 capsule 4  . fluconazole (DIFLUCAN) 100 MG tablet Take 1 tablet (100 mg total) by mouth daily. (Patient not taking: Reported on 05/14/2016) 3 tablet 1   No current facility-administered medications on file prior to visit.     Review of Systems Patient denies any headaches, blurred vision, shortness of breath, chest pain, abdominal pain, problems with bowel movements, urination, or intercourse.  Objective:  BP 117/75   Pulse 75   Ht 5\' 6"  (1.676 m)   Wt 131 lb 6.4 oz (59.6 kg)   BMI 21.21 kg/m  Physical Exam  General:  Well developed, well nourished, no acute distress. She is alert and oriented x3. Skin:  Warm and dry Neck:  Midline trachea, no thyromegaly or nodules Cardiovascular: Regular rate and rhythm, no murmur heard Lungs:  Effort normal, all lung fields clear to auscultation bilaterally Breasts:  No dominant palpable mass, retraction, or nipple discharge Abdomen:  Soft, non tender, no hepatosplenomegaly or masses Pelvic:  External genitalia is normal in appearance.  The vagina is normal in appearance. The cervix is bulbous, no CMT.  Thin prep pap is not done . Uterus is felt to be normal size, shape, and contour.  No adnexal masses or tenderness noted. Extremities:  No swelling or varicosities noted Psych:  She has a normal mood and affect  Assessment:   Healthy well-woman exam HRT   Plan:  Will continue current meds F/U 1 year for AE, or sooner if needed Mammogram ordered Colonoscopy repeat due now- will call and schedule  Alpa Salvo Suzan Nailer, CNM

## 2016-10-22 ENCOUNTER — Other Ambulatory Visit: Payer: Self-pay | Admitting: Obstetrics and Gynecology

## 2016-11-18 ENCOUNTER — Ambulatory Visit: Payer: PRIVATE HEALTH INSURANCE

## 2016-12-01 ENCOUNTER — Ambulatory Visit
Admission: RE | Admit: 2016-12-01 | Discharge: 2016-12-01 | Disposition: A | Discharge status: Home / Self Care | Payer: BLUE CROSS/BLUE SHIELD | Source: Ambulatory Visit | Attending: Obstetrics and Gynecology | Admitting: Obstetrics and Gynecology

## 2016-12-01 DIAGNOSIS — Z1231 Encounter for screening mammogram for malignant neoplasm of breast: Secondary | ICD-10-CM | POA: Insufficient documentation

## 2016-12-01 DIAGNOSIS — Z01419 Encounter for gynecological examination (general) (routine) without abnormal findings: Secondary | ICD-10-CM

## 2017-03-09 ENCOUNTER — Other Ambulatory Visit: Payer: Self-pay | Admitting: Obstetrics and Gynecology

## 2017-05-12 ENCOUNTER — Ambulatory Visit: Payer: Self-pay

## 2017-05-12 DIAGNOSIS — Z0000 Encounter for general adult medical examination without abnormal findings: Secondary | ICD-10-CM

## 2017-05-13 LAB — CBC WITH DIFFERENTIAL/PLATELET
BASOS: 1 %
Basophils Absolute: 0 10*3/uL (ref 0.0–0.2)
EOS (ABSOLUTE): 0.2 10*3/uL (ref 0.0–0.4)
Eos: 4 %
Hematocrit: 39.9 % (ref 34.0–46.6)
Hemoglobin: 13.1 g/dL (ref 11.1–15.9)
Immature Grans (Abs): 0 10*3/uL (ref 0.0–0.1)
Immature Granulocytes: 0 %
LYMPHS: 40 %
Lymphocytes Absolute: 1.6 10*3/uL (ref 0.7–3.1)
MCH: 30.5 pg (ref 26.6–33.0)
MCHC: 32.8 g/dL (ref 31.5–35.7)
MCV: 93 fL (ref 79–97)
Monocytes Absolute: 0.3 10*3/uL (ref 0.1–0.9)
Monocytes: 7 %
Neutrophils Absolute: 2 10*3/uL (ref 1.4–7.0)
Neutrophils: 48 %
PLATELETS: 254 10*3/uL (ref 150–379)
RBC: 4.29 x10E6/uL (ref 3.77–5.28)
RDW: 12.4 % (ref 12.3–15.4)
WBC: 4 10*3/uL (ref 3.4–10.8)

## 2017-05-13 LAB — URINALYSIS, ROUTINE W REFLEX MICROSCOPIC
Bilirubin, UA: NEGATIVE
GLUCOSE, UA: NEGATIVE
KETONES UA: NEGATIVE
NITRITE UA: NEGATIVE
Protein, UA: NEGATIVE
RBC, UA: NEGATIVE
SPEC GRAV UA: 1.012 (ref 1.005–1.030)
UUROB: 0.2 mg/dL (ref 0.2–1.0)
pH, UA: 7.5 (ref 5.0–7.5)

## 2017-05-13 LAB — LIPID PANEL
CHOL/HDL RATIO: 2.3 ratio (ref 0.0–4.4)
Cholesterol, Total: 176 mg/dL (ref 100–199)
HDL: 78 mg/dL (ref >39)
LDL CALC: 80 mg/dL (ref 0–99)
Triglycerides: 89 mg/dL (ref 0–149)
VLDL Cholesterol Cal: 18 mg/dL (ref 5–40)

## 2017-05-13 LAB — COMPREHENSIVE METABOLIC PANEL
A/G RATIO: 1.9 (ref 1.2–2.2)
ALT: 13 IU/L (ref 0–32)
AST: 19 IU/L (ref 0–40)
Albumin: 4.5 g/dL (ref 3.5–5.5)
Alkaline Phosphatase: 88 IU/L (ref 39–117)
BUN/Creatinine Ratio: 14 (ref 9–23)
BUN: 12 mg/dL (ref 6–24)
Bilirubin Total: 0.6 mg/dL (ref 0.0–1.2)
CALCIUM: 9.3 mg/dL (ref 8.7–10.2)
CO2: 26 mmol/L (ref 20–29)
CREATININE: 0.83 mg/dL (ref 0.57–1.00)
Chloride: 103 mmol/L (ref 96–106)
GFR calc Af Amer: 91 mL/min/{1.73_m2} (ref >59)
GFR, EST NON AFRICAN AMERICAN: 79 mL/min/{1.73_m2} (ref >59)
GLUCOSE: 95 mg/dL (ref 65–99)
Globulin, Total: 2.4 g/dL (ref 1.5–4.5)
POTASSIUM: 4.2 mmol/L (ref 3.5–5.2)
Sodium: 142 mmol/L (ref 134–144)
Total Protein: 6.9 g/dL (ref 6.0–8.5)

## 2017-05-13 LAB — SPECIMEN STATUS REPORT

## 2017-05-13 LAB — MICROSCOPIC EXAMINATION: CASTS: NONE SEEN /LPF

## 2017-05-13 LAB — THYROID PANEL
Free Thyroxine Index: 2.4 (ref 1.2–4.9)
T3 Uptake Ratio: 33 % (ref 24–39)
T4 TOTAL: 7.2 ug/dL (ref 4.5–12.0)

## 2017-05-13 LAB — VITAMIN B12: VITAMIN B 12: 519 pg/mL (ref 232–1245)

## 2017-05-20 ENCOUNTER — Encounter: Payer: Self-pay | Admitting: Obstetrics and Gynecology

## 2017-05-20 ENCOUNTER — Ambulatory Visit (INDEPENDENT_AMBULATORY_CARE_PROVIDER_SITE_OTHER): Payer: BLUE CROSS/BLUE SHIELD | Admitting: Obstetrics and Gynecology

## 2017-05-20 ENCOUNTER — Other Ambulatory Visit: Payer: Self-pay | Admitting: Obstetrics and Gynecology

## 2017-05-20 VITALS — BP 117/67 | HR 65 | Ht 66.0 in | Wt 127.6 lb

## 2017-05-20 DIAGNOSIS — Z01419 Encounter for gynecological examination (general) (routine) without abnormal findings: Secondary | ICD-10-CM

## 2017-05-20 NOTE — Progress Notes (Signed)
Subjective:   Phyllis Barnett is a 56 y.o. G72P1003 Caucasian female here for a routine well-woman exam.  No LMP recorded. Patient is postmenopausal.    Current complaints: still struggles with vaginal dryness and low sex drive. PCP: Hyacinth Meeker       doesn't desire labs, PCP does them  Social History: Sexual: heterosexual Marital Status: married Living situation: with spouse Occupation: Runner, broadcasting/film/video at [employer] Tobacco/alcohol: no tobacco use Illicit drugs: no history of illicit drug use  The following portions of the patient's history were reviewed and updated as appropriate: allergies, current medications, past family history, past medical history, past social history, past surgical history and problem list.  Past Medical History Past Medical History:  Diagnosis Date  . Vaginal Pap smear, abnormal     Past Surgical History Past Surgical History:  Procedure Laterality Date  . LAPAROSCOPY  2011   benign tumor removed    Gynecologic History G3P1003  No LMP recorded. Patient is postmenopausal. Contraception: post menopausal status Last Pap: ?Marland Kitchen Results were: normal Last mammogram: 11/2016. Results were: normal   Obstetric History OB History  Gravida Para Term Preterm AB Living  SAB TAB Ectopic Multiple Live Births          3    # Outcome Date GA Lbr Len/2nd Weight Sex Delivery Anes PTL Lv  3 Gravida 1996    M Vag-Spont   LIV  2 Gravida 1992    M Vag-Spont   LIV  1 Term 1990    F Vag-Spont   LIV      Current Medications Current Outpatient Prescriptions on File Prior to Visit  Medication Sig Dispense Refill  . cholecalciferol (VITAMIN D) 1000 UNITS tablet Take 1,000 Units by mouth daily.    Marland Kitchen levothyroxine (SYNTHROID, LEVOTHROID) 75 MCG tablet Take 75 mcg by mouth daily before breakfast.    . progesterone (PROMETRIUM) 200 MG capsule TAKE 1 CAPSULE BY MOUTH DAILY 30 capsule 2  . fluconazole (DIFLUCAN) 100 MG tablet Take 1 tablet (100 mg total) by mouth daily.  (Patient not taking: Reported on 05/14/2016) 3 tablet 1  . nitrofurantoin, macrocrystal-monohydrate, (MACROBID) 100 MG capsule Take 1 capsule (100 mg total) by mouth 2 (two) times daily. (Patient not taking: Reported on 05/20/2017) 14 capsule 0   No current facility-administered medications on file prior to visit.     Review of Systems Patient denies any headaches, blurred vision, shortness of breath, chest pain, abdominal pain, problems with bowel movements, urination, or intercourse.  Objective:  BP 117/67   Pulse 65   Ht  (1.676 m)   Wt 127 lb 9.6 oz (57.9 kg)   BMI 20.60 kg/m  Physical Exam  General:  Well developed, well nourished, no acute distress. She is alert and oriented x3. Skin:  Warm and dry Neck:  Midline trachea, no thyromegaly or nodules Cardiovascular: Regular rate and rhythm, no murmur heard Lungs:  Effort normal, all lung fields clear to auscultation bilaterally Breasts:  No dominant palpable mass, retraction, or nipple discharge Abdomen:  Soft, non tender, no hepatosplenomegaly or masses Pelvic:  External genitalia is normal in appearance.  The vagina is normal in appearance. The cervix is bulbous, no CMT.  Thin prep pap is done with HR HPV cotesting. Uterus is felt to be normal size, shape, and contour.  No adnexal masses or tenderness noted. Extremities:  No swelling or varicosities noted Psych:  She has a normal mood and affect  Assessment:   Healthy well-woman exam Family history of prostrate cancer in dad Plan:   F/U 1 year for AE, or sooner if needed Mammogram needed next year or sooner if problems   Alianys Chacko Suzan Nailer, CNM

## 2017-05-21 DIAGNOSIS — E039 Hypothyroidism, unspecified: Secondary | ICD-10-CM | POA: Insufficient documentation

## 2017-05-25 LAB — CYTOLOGY - PAP

## 2017-06-22 ENCOUNTER — Other Ambulatory Visit: Payer: Self-pay | Admitting: Obstetrics and Gynecology

## 2017-10-04 ENCOUNTER — Other Ambulatory Visit: Payer: Self-pay | Admitting: *Deleted

## 2017-10-04 MED ORDER — PROGESTERONE MICRONIZED 200 MG PO CAPS: 200.0000 mg | ORAL_CAPSULE | Freq: Every day | ORAL | 2 refills | Status: DC

## 2017-12-04 ENCOUNTER — Other Ambulatory Visit: Payer: Self-pay | Admitting: Obstetrics and Gynecology

## 2017-12-15 ENCOUNTER — Ambulatory Visit
Admission: RE | Admit: 2017-12-15 | Discharge: 2017-12-15 | Disposition: A | Discharge status: Home / Self Care | Payer: BLUE CROSS/BLUE SHIELD | Source: Ambulatory Visit | Attending: Obstetrics and Gynecology | Admitting: Obstetrics and Gynecology

## 2017-12-15 DIAGNOSIS — Z1231 Encounter for screening mammogram for malignant neoplasm of breast: Secondary | ICD-10-CM | POA: Insufficient documentation

## 2017-12-15 DIAGNOSIS — Z01419 Encounter for gynecological examination (general) (routine) without abnormal findings: Secondary | ICD-10-CM

## 2018-03-02 ENCOUNTER — Other Ambulatory Visit: Payer: Self-pay | Admitting: Obstetrics and Gynecology

## 2018-05-03 ENCOUNTER — Ambulatory Visit: Payer: Self-pay

## 2018-05-26 ENCOUNTER — Encounter: Payer: BLUE CROSS/BLUE SHIELD | Admitting: Obstetrics and Gynecology

## 2018-06-02 ENCOUNTER — Ambulatory Visit (INDEPENDENT_AMBULATORY_CARE_PROVIDER_SITE_OTHER): Payer: BLUE CROSS/BLUE SHIELD | Admitting: Obstetrics and Gynecology

## 2018-06-02 ENCOUNTER — Encounter: Payer: Self-pay | Admitting: Obstetrics and Gynecology

## 2018-06-02 VITALS — BP 116/85 | HR 85 | Ht 66.5 in | Wt 128.8 lb

## 2018-06-02 DIAGNOSIS — Z01419 Encounter for gynecological examination (general) (routine) without abnormal findings: Secondary | ICD-10-CM | POA: Diagnosis not present

## 2018-06-02 NOTE — Progress Notes (Signed)
Subjective:   Phyllis Barnett is a 57 y.o. G25P1003 Caucasian female here for a routine well-woman exam.  No LMP recorded. Patient is postmenopausal.    Current complaints: mild depression since dad's last hospitalization. PCP: Bethann Punches       PCP does labs  Social History: Sexual: heterosexual Marital Status: married Living situation: with family Occupation: Runner, broadcasting/film/video at [employer] Tobacco/alcohol: no tobacco use Illicit drugs: no history of illicit drug use  The following portions of the patient's history were reviewed and updated as appropriate: allergies, current medications, past family history, past medical history, past social history, past surgical history and problem list.  Past Medical History Past Medical History:  Diagnosis Date  . Vaginal Pap smear, abnormal     Past Surgical History Past Surgical History:  Procedure Laterality Date  . LAPAROSCOPY  2011   benign tumor removed    Gynecologic History G3P1003  No LMP recorded. Patient is postmenopausal. Contraception: post menopausal status Last Pap: 2018. Results were: normal Last mammogram: 12/2017. Results were: normal   Obstetric History OB History  Gravida Para Term Preterm AB Living  3 1 1     3   SAB TAB Ectopic Multiple Live Births          3    # Outcome Date GA Lbr Len/2nd Weight Sex Delivery Anes PTL Lv  3 Gravida 1996    M Vag-Spont   LIV  2 Gravida 1992    M Vag-Spont   LIV  1 Term 1990    F Vag-Spont   LIV    Current Medications Current Outpatient Medications on File Prior to Visit  Medication Sig Dispense Refill  . calcium carbonate (OSCAL) 1500 (600 Ca) MG TABS tablet Take by mouth 2 (two) times daily with a meal.    . cholecalciferol (VITAMIN D) 1000 UNITS tablet Take 1,000 Units by mouth daily.    Marland Kitchen levothyroxine (SYNTHROID, LEVOTHROID) 75 MCG tablet Take 75 mcg by mouth daily before breakfast.    . progesterone (PROMETRIUM) 200 MG capsule TAKE 1 CAPSULE BY MOUTH EVERY DAY 30 capsule 2   . fluconazole (DIFLUCAN) 100 MG tablet Take 1 tablet (100 mg total) by mouth daily. (Patient not taking: Reported on 05/14/2016) 3 tablet 1  . nitrofurantoin, macrocrystal-monohydrate, (MACROBID) 100 MG capsule Take 1 capsule (100 mg total) by mouth 2 (two) times daily. (Patient not taking: Reported on 05/20/2017) 14 capsule 0   No current facility-administered medications on file prior to visit.     Review of Systems Patient denies any headaches, blurred vision, shortness of breath, chest pain, abdominal pain, problems with bowel movements, urination, or intercourse.  Objective:  BP 116/85   Pulse 85   Ht 5' 6.5" (1.689 m)   Wt 128 lb 12.8 oz (58.4 kg)   BMI 20.48 kg/m  Physical Exam  General:  Well developed, well nourished, no acute distress. She is alert and oriented x3. Skin:  Warm and dry Neck:  Midline trachea, no thyromegaly or nodules Cardiovascular: Regular rate and rhythm, no murmur heard Lungs:  Effort normal, all lung fields clear to auscultation bilaterally Breasts:  No dominant palpable mass, retraction, or nipple discharge Abdomen:  Soft, non tender, no hepatosplenomegaly or masses Pelvic:  External genitalia is normal in appearance.  The vagina is normal in appearance. The cervix is bulbous, no CMT.  Thin prep pap is not done . Uterus is felt to be normal size, shape, and contour.  No adnexal masses or tenderness noted. Extremities:  No swelling or varicosities noted Psych:  She has a normal mood and affect  Assessment:   Healthy well-woman exam  Plan:   F/U 1 year for AE, or sooner if needed Mammogram ordered  Avier Jech Rockney Ghee, CNM

## 2018-06-10 ENCOUNTER — Other Ambulatory Visit: Payer: Self-pay | Admitting: Obstetrics and Gynecology

## 2018-06-15 ENCOUNTER — Other Ambulatory Visit: Payer: Self-pay

## 2018-06-15 DIAGNOSIS — Z0000 Encounter for general adult medical examination without abnormal findings: Secondary | ICD-10-CM

## 2018-06-16 ENCOUNTER — Other Ambulatory Visit: Payer: Self-pay

## 2018-06-16 DIAGNOSIS — Z0000 Encounter for general adult medical examination without abnormal findings: Secondary | ICD-10-CM

## 2018-06-17 LAB — LIPID PANEL
CHOLESTEROL TOTAL: 188 mg/dL (ref 100–199)
Chol/HDL Ratio: 2.3 ratio (ref 0.0–4.4)
HDL: 81 mg/dL (ref >39)
LDL Calculated: 97 mg/dL (ref 0–99)
Triglycerides: 52 mg/dL (ref 0–149)
VLDL CHOLESTEROL CAL: 10 mg/dL (ref 5–40)

## 2018-06-17 LAB — URINALYSIS, ROUTINE W REFLEX MICROSCOPIC
BILIRUBIN UA: NEGATIVE
Glucose, UA: NEGATIVE
Ketones, UA: NEGATIVE
LEUKOCYTES UA: NEGATIVE
Nitrite, UA: NEGATIVE
PH UA: 6.5 (ref 5.0–7.5)
Protein, UA: NEGATIVE
RBC, UA: NEGATIVE
Specific Gravity, UA: 1.02 (ref 1.005–1.030)
Urobilinogen, Ur: 0.2 mg/dL (ref 0.2–1.0)

## 2018-06-17 LAB — COMPREHENSIVE METABOLIC PANEL
ALK PHOS: 84 IU/L (ref 39–117)
ALT: 14 IU/L (ref 0–32)
AST: 18 IU/L (ref 0–40)
Albumin/Globulin Ratio: 2.2 (ref 1.2–2.2)
Albumin: 4.6 g/dL (ref 3.5–5.5)
BUN/Creatinine Ratio: 18 (ref 9–23)
BUN: 15 mg/dL (ref 6–24)
Bilirubin Total: 0.5 mg/dL (ref 0.0–1.2)
CALCIUM: 9.6 mg/dL (ref 8.7–10.2)
CO2: 24 mmol/L (ref 20–29)
CREATININE: 0.83 mg/dL (ref 0.57–1.00)
Chloride: 102 mmol/L (ref 96–106)
GFR calc Af Amer: 90 mL/min/{1.73_m2} (ref >59)
GFR, EST NON AFRICAN AMERICAN: 78 mL/min/{1.73_m2} (ref >59)
GLUCOSE: 96 mg/dL (ref 65–99)
Globulin, Total: 2.1 g/dL (ref 1.5–4.5)
Potassium: 4.1 mmol/L (ref 3.5–5.2)
Sodium: 143 mmol/L (ref 134–144)
Total Protein: 6.7 g/dL (ref 6.0–8.5)

## 2018-06-17 LAB — CBC WITH DIFFERENTIAL/PLATELET
BASOS: 1 %
Basophils Absolute: 0.1 10*3/uL (ref 0.0–0.2)
EOS (ABSOLUTE): 0.2 10*3/uL (ref 0.0–0.4)
EOS: 3 %
HEMATOCRIT: 36.3 % (ref 34.0–46.6)
HEMOGLOBIN: 13 g/dL (ref 11.1–15.9)
IMMATURE GRANS (ABS): 0 10*3/uL (ref 0.0–0.1)
IMMATURE GRANULOCYTES: 0 %
LYMPHS: 23 %
Lymphocytes Absolute: 1.4 10*3/uL (ref 0.7–3.1)
MCH: 32.8 pg (ref 26.6–33.0)
MCHC: 35.8 g/dL — ABNORMAL HIGH (ref 31.5–35.7)
MCV: 92 fL (ref 79–97)
MONOCYTES: 7 %
Monocytes Absolute: 0.4 10*3/uL (ref 0.1–0.9)
NEUTROS PCT: 66 %
Neutrophils Absolute: 4.2 10*3/uL (ref 1.4–7.0)
Platelets: 252 10*3/uL (ref 150–450)
RBC: 3.96 x10E6/uL (ref 3.77–5.28)
RDW: 11.6 % — AB (ref 12.3–15.4)
WBC: 6.3 10*3/uL (ref 3.4–10.8)

## 2018-06-17 LAB — THYROID PANEL WITH TSH
Free Thyroxine Index: 2.2 (ref 1.2–4.9)
T3 Uptake Ratio: 30 % (ref 24–39)
T4 TOTAL: 7.2 ug/dL (ref 4.5–12.0)
TSH: 0.722 u[IU]/mL (ref 0.450–4.500)

## 2018-06-17 LAB — VITAMIN D 25 HYDROXY (VIT D DEFICIENCY, FRACTURES): VIT D 25 HYDROXY: 50.7 ng/mL (ref 30.0–100.0)

## 2018-06-17 LAB — VITAMIN B12: Vitamin B-12: 494 pg/mL (ref 232–1245)

## 2018-09-28 ENCOUNTER — Encounter: Payer: Self-pay | Admitting: Medical

## 2018-09-28 ENCOUNTER — Ambulatory Visit: Payer: Self-pay | Admitting: Medical

## 2018-09-28 VITALS — BP 131/79 | HR 70 | Temp 98.4°F | Resp 16 | Wt 130.2 lb

## 2018-09-28 DIAGNOSIS — M25561 Pain in right knee: Secondary | ICD-10-CM

## 2018-09-28 NOTE — Patient Instructions (Signed)

## 2018-09-28 NOTE — Progress Notes (Signed)
   Subjective:    Patient ID: [employer], female    DOB: Jan 01, 1961, 58 y.o.   MRN: [id number]  HPI  58 yo female in non acute distress.  Jumped off  a 2 foot  ledge one month ago instantly with pain, since then  on and off pain located behind the  right knee,, Did not hear a pop or feel a snap.. She continued to run and walk which is her daily routine approximately  2 miles. Rested last week due to rain.  Monday ran and  Had no pain  ( after one week of rest), then on Tuesday(09/27/2018)  Increased pain with running. Has notice different shoes make it hurt worse..  Painful going up stairs and going down is worse.  Taking Ibuprofen 400 mg daily if the pain bothers her.  Denies limping..  Denies numbness or tingling, on occasion it has radiated to the lower leg just below the knee.   Did do a week of Ibuprofen with no improvement 400 mg in am and pm. Seemed to improve.But still could feel it..    Review of Systems  Musculoskeletal: Positive for gait problem (painful going up and down starirs.  Worse with going down stairs.).  Skin: Negative for color change (Denies bruising at time of injury.).       Objective:   Physical Exam Vitals signs and nursing note reviewed.  Constitutional:      Appearance: Normal appearance.  HENT:     Head: Normocephalic and atraumatic.     Mouth/Throat:     Mouth: Mucous membranes are moist.     Pharynx: Oropharynx is clear.  Eyes:     Extraocular Movements: Extraocular movements intact.     Conjunctiva/sclera: Conjunctivae normal.     Pupils: Pupils are equal, round, and reactive to light.  Neck:     Musculoskeletal: Normal range of motion.  Pulmonary:     Effort: Pulmonary effort is normal.  Musculoskeletal:        General: Swelling present. No tenderness.  Skin:    General: Skin is warm and dry.  Neurological:     General: No focal deficit present.     Mental Status: She is alert and oriented to person, place, and time.   Psychiatric:        Mood and Affect: Mood normal.        Behavior: Behavior normal.        Thought Content: Thought content normal.        Judgment: Judgment normal.    Swelling noted on the posterior right  knee laterally, non tender to palpation, Negative drawers test. Multiple varicose veins noted. Neg SLR ,can flex and extend with  no pain, Stepped down from the exam table with out difficulty. Gait steady and purposeful 2+PT/DP right lower extremity. Noted variocosities of the posterior knee area..    Assessment & Plan:  Right posterior knee pain x 1 month To follow up with [facility] for further evaluation. To not run till seen by Orthopedics. May take Ibuprofen 400 mg every 6 hours with food. Patient verbalizes understanding and has no questions at discharge.

## 2018-10-01 IMAGING — MG MM DIGITAL SCREENING BILAT W/ CAD
4 series · 4 of 4 positions shown · non-contrast
Comparison: Previous exam(s).

CLINICAL DATA: Screening.

EXAM:
DIGITAL SCREENING BILATERAL MAMMOGRAM WITH CAD

[R CC]
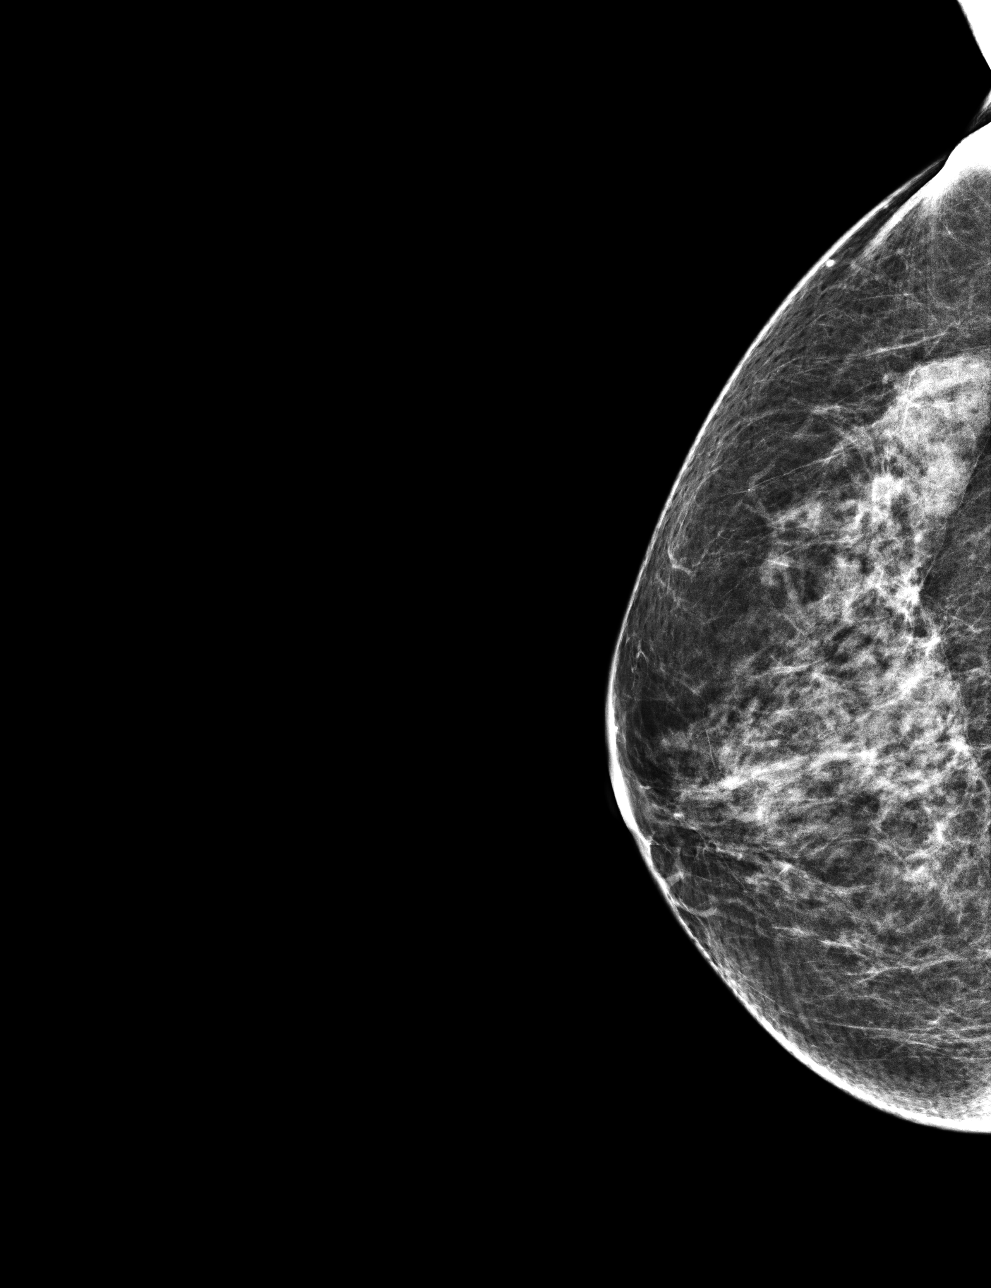

[L CC]
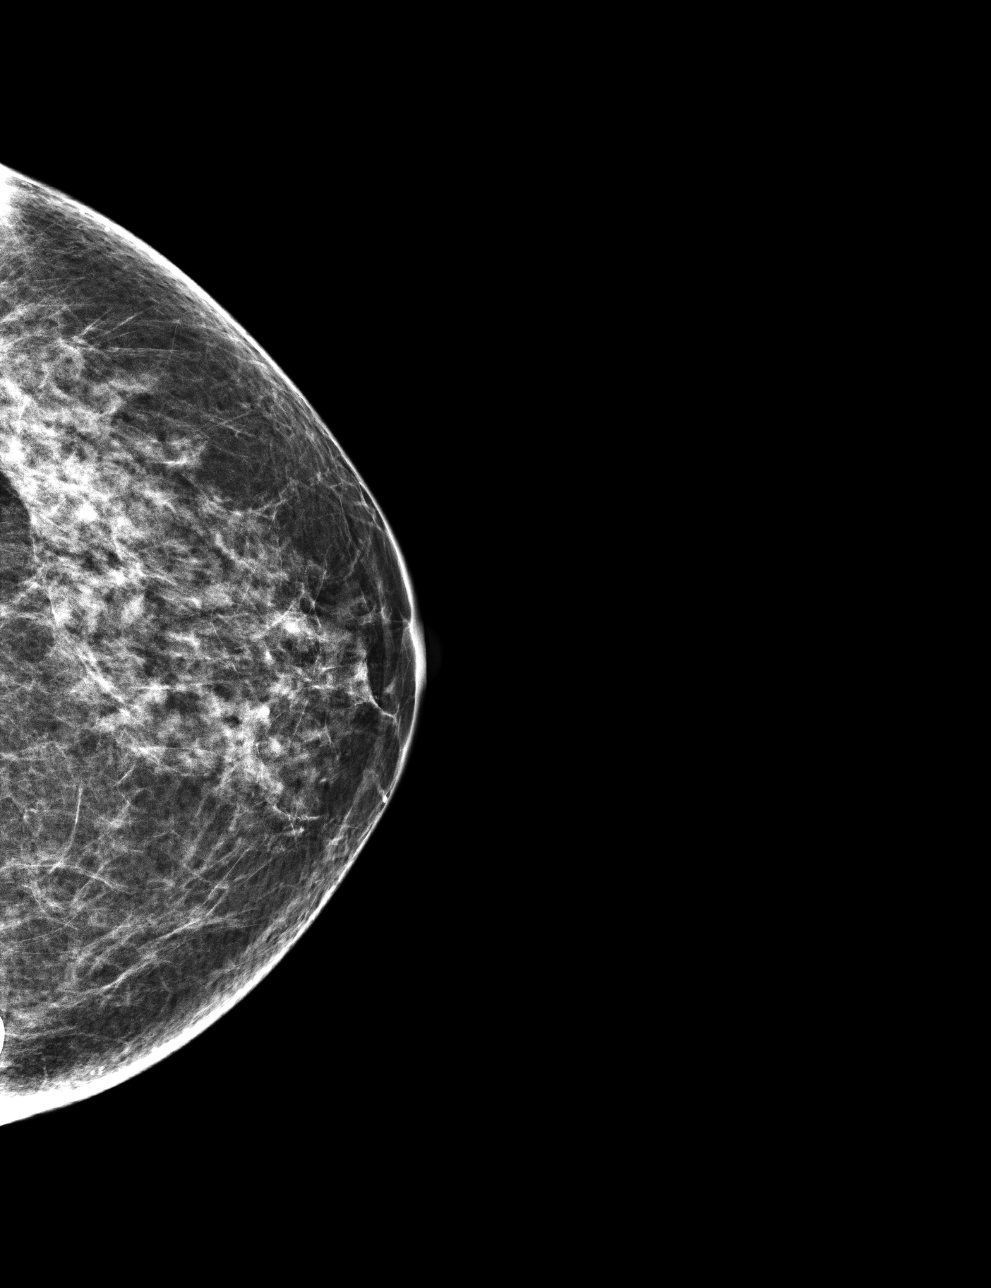

[R MLO]
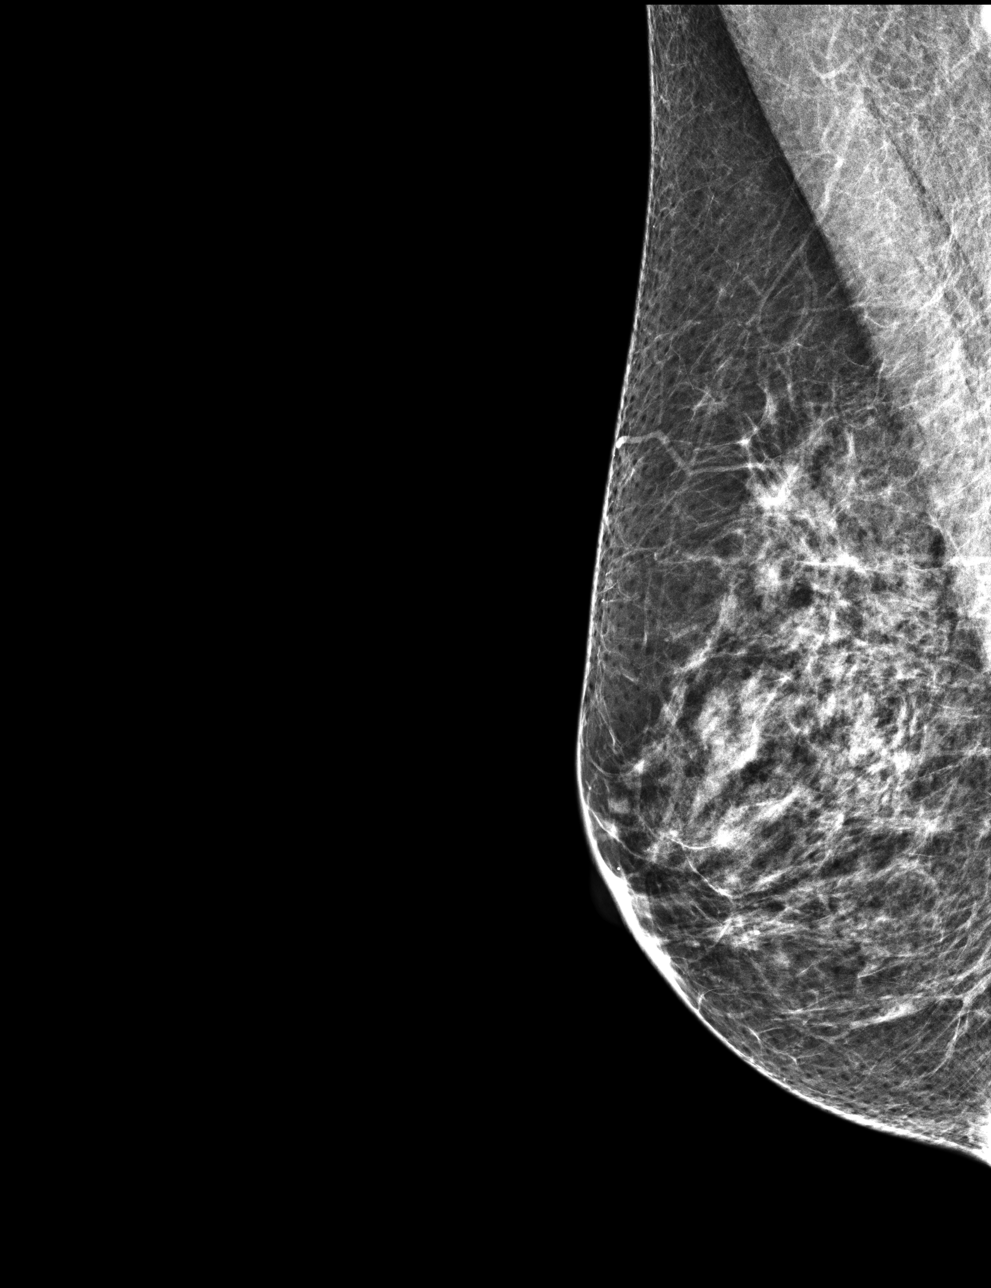

[L MLO]
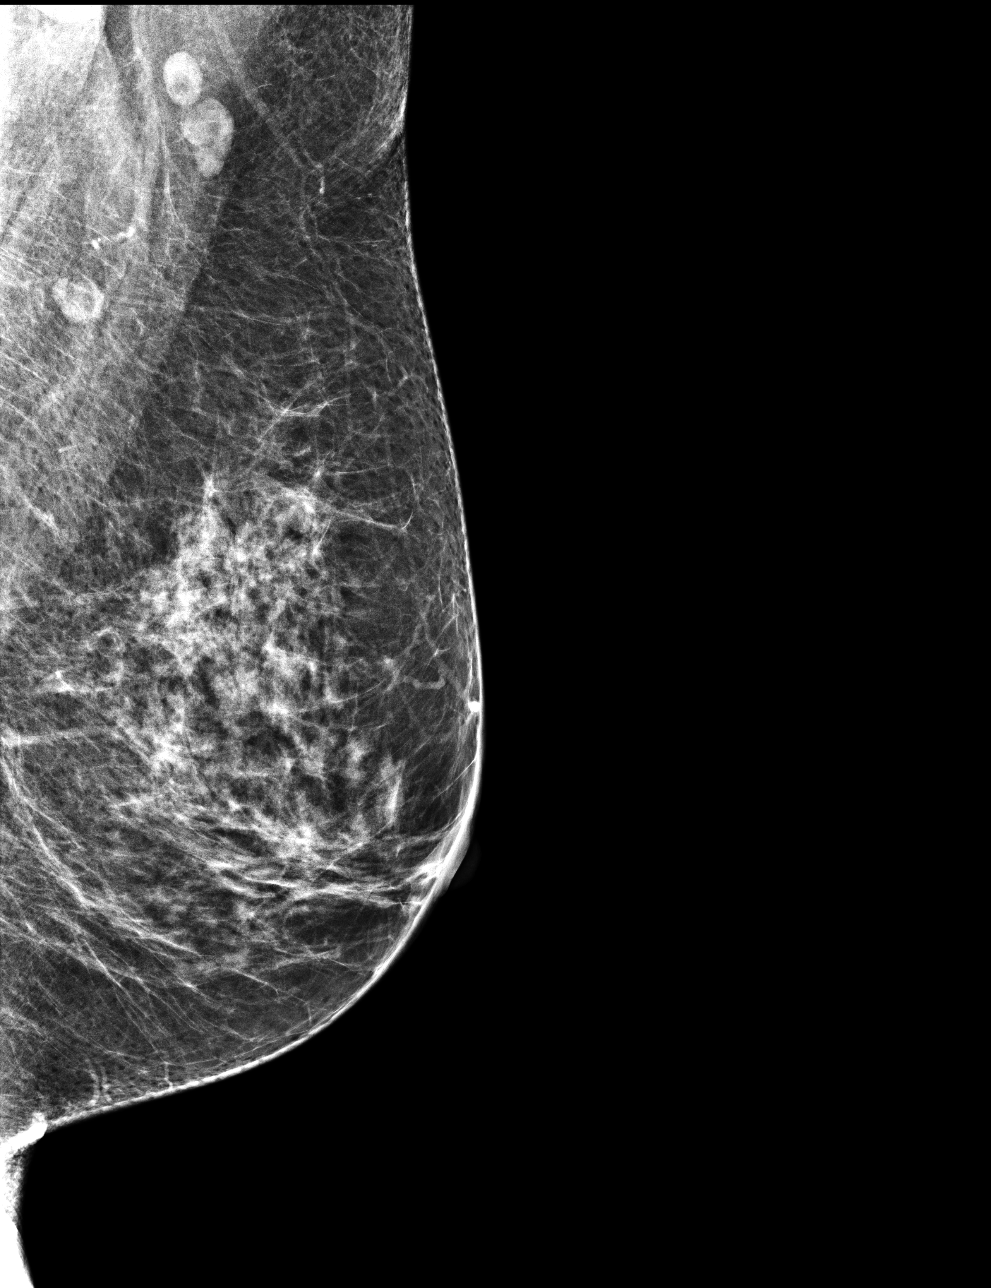

[4 of 4 positions shown; findings below may reference images not displayed]

ACR Breast Density Category c: The breast tissue is heterogeneously
dense, which may obscure small masses.
FINDINGS: There are no findings suspicious for malignancy. Images were
processed with CAD.
IMPRESSION: No mammographic evidence of malignancy. A result letter of this
screening mammogram will be mailed directly to the patient.

RECOMMENDATION:
Screening mammogram in one year. (Code:YJ-[id number])

BI-RADS CATEGORY  1: Negative.

## 2018-11-01 ENCOUNTER — Other Ambulatory Visit: Payer: Self-pay | Admitting: Obstetrics and Gynecology

## 2018-11-01 DIAGNOSIS — Z1231 Encounter for screening mammogram for malignant neoplasm of breast: Secondary | ICD-10-CM

## 2018-11-07 ENCOUNTER — Other Ambulatory Visit: Payer: Self-pay | Admitting: *Deleted

## 2018-11-07 MED ORDER — PROGESTERONE MICRONIZED 200 MG PO CAPS: 200.0000 mg | ORAL_CAPSULE | Freq: Every day | ORAL | 6 refills | Status: DC

## 2019-01-17 ENCOUNTER — Ambulatory Visit
Admission: RE | Admit: 2019-01-17 | Discharge: 2019-01-17 | Disposition: A | Discharge status: Home / Self Care | Payer: BC Managed Care – PPO | Source: Ambulatory Visit | Attending: Obstetrics and Gynecology | Admitting: Obstetrics and Gynecology

## 2019-01-17 ENCOUNTER — Other Ambulatory Visit: Payer: Self-pay

## 2019-01-17 DIAGNOSIS — Z1231 Encounter for screening mammogram for malignant neoplasm of breast: Secondary | ICD-10-CM | POA: Insufficient documentation

## 2019-03-30 ENCOUNTER — Other Ambulatory Visit: Payer: Self-pay | Admitting: Unknown Physician Specialty

## 2019-03-30 DIAGNOSIS — R42 Dizziness and giddiness: Secondary | ICD-10-CM

## 2019-04-03 ENCOUNTER — Other Ambulatory Visit: Payer: Self-pay | Admitting: Unknown Physician Specialty

## 2019-04-03 DIAGNOSIS — R42 Dizziness and giddiness: Secondary | ICD-10-CM

## 2019-04-10 ENCOUNTER — Ambulatory Visit: Payer: BC Managed Care – PPO

## 2019-04-29 ENCOUNTER — Ambulatory Visit
Admission: RE | Admit: 2019-04-29 | Discharge: 2019-04-29 | Disposition: A | Discharge status: Home / Self Care | Payer: BC Managed Care – PPO | Source: Ambulatory Visit | Attending: Unknown Physician Specialty | Admitting: Unknown Physician Specialty

## 2019-04-29 DIAGNOSIS — R42 Dizziness and giddiness: Secondary | ICD-10-CM

## 2019-04-29 MED ORDER — GADOBENATE DIMEGLUMINE 529 MG/ML IV SOLN
11.0000 mL | Freq: Once | INTRAVENOUS | Status: AC | PRN
  Administered 2019-04-29: 11 mL via INTRAVENOUS

## 2019-05-20 ENCOUNTER — Other Ambulatory Visit: Payer: Self-pay | Admitting: Obstetrics and Gynecology

## 2019-05-24 ENCOUNTER — Other Ambulatory Visit: Payer: Self-pay

## 2019-05-24 ENCOUNTER — Ambulatory Visit: Payer: Self-pay

## 2019-05-24 DIAGNOSIS — Z23 Encounter for immunization: Secondary | ICD-10-CM

## 2019-06-08 ENCOUNTER — Ambulatory Visit (INDEPENDENT_AMBULATORY_CARE_PROVIDER_SITE_OTHER): Payer: BC Managed Care – PPO | Admitting: Obstetrics and Gynecology

## 2019-06-08 ENCOUNTER — Encounter: Payer: Self-pay | Admitting: Obstetrics and Gynecology

## 2019-06-08 ENCOUNTER — Other Ambulatory Visit: Payer: Self-pay

## 2019-06-08 ENCOUNTER — Encounter: Payer: BLUE CROSS/BLUE SHIELD | Admitting: Obstetrics and Gynecology

## 2019-06-08 VITALS — BP 108/72 | HR 63 | Ht 66.0 in | Wt 131.5 lb

## 2019-06-08 DIAGNOSIS — N941 Unspecified dyspareunia: Secondary | ICD-10-CM | POA: Diagnosis not present

## 2019-06-08 DIAGNOSIS — Z01419 Encounter for gynecological examination (general) (routine) without abnormal findings: Secondary | ICD-10-CM | POA: Diagnosis not present

## 2019-06-08 MED ORDER — ESTRADIOL 0.1 MG/GM VA CREA: 1.0000 | TOPICAL_CREAM | VAGINAL | 2 refills | Status: DC

## 2019-06-08 NOTE — Progress Notes (Signed)
Subjective:   Phyllis Barnett is a 58 y.o. G19P1003 Caucasian female here for a routine well-woman exam.  No LMP recorded. Patient is postmenopausal.    Current complaints: had vertigo this past year, has resolved since. Reports worsening pain after sex and some bleeding from introitus. Open to estrogen cream. PCP: Sabra Heck       doesn't desire labs  Social History: Sexual: heterosexual Marital Status: married Living situation: with spouse Occupation: Pharmacist, hospital at [employer] Tobacco/alcohol: no tobacco use Illicit drugs: no history of illicit drug use  The following portions of the patient's history were reviewed and updated as appropriate: allergies, current medications, past family history, past medical history, past social history, past surgical history and problem list.  Past Medical History Past Medical History:  Diagnosis Date  . Vaginal Pap smear, abnormal     Past Surgical History Past Surgical History:  Procedure Laterality Date  . cyst removal on face    . LAPAROSCOPY  2011   benign tumor removed    Gynecologic History G3P1003  No LMP recorded. Patient is postmenopausal. Contraception: post menopausal status Last Pap: 2018. Results were: normal Last mammogram: 01/2019. Results were: normal   Obstetric History OB History  Gravida Para Term Preterm AB Living  3 1 1     3   SAB TAB Ectopic Multiple Live Births          3    # Outcome Date GA Lbr Len/2nd Weight Sex Delivery Anes PTL Lv  3 Gravida 1996    M Vag-Spont   LIV  2 Gravida 1992    M Vag-Spont   LIV  1 Term 1990    F Vag-Spont   LIV    Current Medications Current Outpatient Medications on File Prior to Visit  Medication Sig Dispense Refill  . calcium carbonate (OSCAL) 1500 (600 Ca) MG TABS tablet Take by mouth 2 (two) times daily with a meal.    . cholecalciferol (VITAMIN D) 1000 UNITS tablet Take 1,000 Units by mouth daily.    Marland Kitchen levothyroxine (SYNTHROID, LEVOTHROID) 75 MCG tablet Take 75 mcg by mouth  daily before breakfast.    . progesterone (PROMETRIUM) 200 MG capsule TAKE 1 CAPSULE BY MOUTH ONCE DAILY 30 capsule 0   No current facility-administered medications on file prior to visit.     Review of Systems Patient denies any headaches, blurred vision, shortness of breath, chest pain, abdominal pain, problems with bowel movements, urination, or intercourse.  Objective:  BP 108/72   Pulse 63   Ht 5\' 6"  (1.676 m)   Wt 131 lb 8 oz (59.6 kg)   BMI 21.22 kg/m  Physical Exam  General:  Well developed, well nourished, no acute distress. She is alert and oriented x3. Skin:  Warm and dry Neck:  Midline trachea, no thyromegaly or nodules Cardiovascular: Regular rate and rhythm, no murmur heard Lungs:  Effort normal, all lung fields clear to auscultation bilaterally Breasts:  No dominant palpable mass, retraction, or nipple discharge Abdomen:  Soft, non tender, no hepatosplenomegaly or masses Pelvic:  External genitalia is normal in appearance.  The vagina is normal in appearance. The cervix is bulbous, no CMT.  Thin prep pap is not done . Uterus is felt to be normal size, shape, and contour.  No adnexal masses or tenderness noted. Extremities:  No swelling or varicosities noted Psych:  She has a normal mood and affect  Assessment:   Healthy well-woman exam dysparenia  Plan:  Will try estrace cream, apply  topically 2 times a week for 1-2 months, then weekly as needed. PCP will do screening labs. F/U 1 year for AE, or sooner if needed Mammogram up to date    Suzan Nailer, [place]

## 2019-06-08 NOTE — Progress Notes (Signed)
Pt present for annual exam. Pt stated that she was doing well no problems.  

## 2019-06-08 NOTE — Patient Instructions (Signed)
Preventive Care 23-59 Years Old, Female Preventive care refers to visits with your health care provider and lifestyle choices that can promote health and wellness. This includes:  A yearly physical exam. This may also be called an annual well check.  Regular dental visits and eye exams.  Immunizations.  Screening for certain conditions.  Healthy lifestyle choices, such as eating a healthy diet, getting regular exercise, not using drugs or products that contain nicotine and tobacco, and limiting alcohol use. What can I expect for my preventive care visit? Physical exam Your health care provider will check your:  Height and weight. This may be used to calculate body mass index (BMI), which tells if you are at a healthy weight.  Heart rate and blood pressure.  Skin for abnormal spots. Counseling Your health care provider may ask you questions about your:  Alcohol, tobacco, and drug use.  Emotional well-being.  Home and relationship well-being.  Sexual activity.  Eating habits.  Work and work Statistician.  Method of birth control.  Menstrual cycle.  Pregnancy history. What immunizations do I need?  Influenza (flu) vaccine  This is recommended every year. Tetanus, diphtheria, and pertussis (Tdap) vaccine  You may need a Td booster every 10 years. Varicella (chickenpox) vaccine  You may need this if you have not been vaccinated. Zoster (shingles) vaccine  You may need this after age 75. Measles, mumps, and rubella (MMR) vaccine  You may need at least one dose of MMR if you were born in 1957 or later. You may also need a second dose. Pneumococcal conjugate (PCV13) vaccine  You may need this if you have certain conditions and were not previously vaccinated. Pneumococcal polysaccharide (PPSV23) vaccine  You may need one or two doses if you smoke cigarettes or if you have certain conditions. Meningococcal conjugate (MenACWY) vaccine  You may need this if you  have certain conditions. Hepatitis A vaccine  You may need this if you have certain conditions or if you travel or work in places where you may be exposed to hepatitis A. Hepatitis B vaccine  You may need this if you have certain conditions or if you travel or work in places where you may be exposed to hepatitis B. Haemophilus influenzae type b (Hib) vaccine  You may need this if you have certain conditions. Human papillomavirus (HPV) vaccine  If recommended by your health care provider, you may need three doses over 6 months. You may receive vaccines as individual doses or as more than one vaccine together in one shot (combination vaccines). Talk with your health care provider about the risks and benefits of combination vaccines. What tests do I need? Blood tests  Lipid and cholesterol levels. These may be checked every 5 years, or more frequently if you are over 31 years old.  Hepatitis C test.  Hepatitis B test. Screening  Lung cancer screening. You may have this screening every year starting at age 19 if you have a 30-pack-year history of smoking and currently smoke or have quit within the past 15 years.  Colorectal cancer screening. All adults should have this screening starting at age 43 and continuing until age 16. Your health care provider may recommend screening at age 59 if you are at increased risk. You will have tests every 1-10 years, depending on your results and the type of screening test.  Diabetes screening. This is done by checking your blood sugar (glucose) after you have not eaten for a while (fasting). You may have this  done every 1-3 years.  Mammogram. This may be done every 1-2 years. Talk with your health care provider about when you should start having regular mammograms. This may depend on whether you have a family history of breast cancer.  BRCA-related cancer screening. This may be done if you have a family history of breast, ovarian, tubal, or peritoneal  cancers.  Pelvic exam and Pap test. This may be done every 3 years starting at age 23. Starting at age 86, this may be done every 5 years if you have a Pap test in combination with an HPV test. Other tests  Sexually transmitted disease (STD) testing.  Bone density scan. This is done to screen for osteoporosis. You may have this scan if you are at high risk for osteoporosis. Follow these instructions at home: Eating and drinking  Eat a diet that includes fresh fruits and vegetables, whole grains, lean protein, and low-fat dairy.  Take vitamin and mineral supplements as recommended by your health care provider.  Do not drink alcohol if: ? Your health care provider tells you not to drink. ? You are pregnant, may be pregnant, or are planning to become pregnant.  If you drink alcohol: ? Limit how much you have to 0-1 drink a day. ? Be aware of how much alcohol is in your drink. In the [place] one drink equals one 12 oz bottle of beer (355 mL), one 5 oz glass of wine (148 mL), or one 1 oz glass of hard liquor (44 mL). Lifestyle  Take daily care of your teeth and gums.  Stay active. Exercise for at least 30 minutes on 5 or more days each week.  Do not use any products that contain nicotine or tobacco, such as cigarettes, e-cigarettes, and chewing tobacco. If you need help quitting, ask your health care provider.  If you are sexually active, practice safe sex. Use a condom or other form of birth control (contraception) in order to prevent pregnancy and STIs (sexually transmitted infections).  If told by your health care provider, take low-dose aspirin daily starting at age 27. What's next?  Visit your health care provider once a year for a well check visit.  Ask your health care provider how often you should have your eyes and teeth checked.  Stay up to date on all vaccines. This information is not intended to replace advice given to you by your health care provider. Make sure you  discuss any questions you have with your health care provider. Document Released: 08/23/2015 Document Revised: 04/07/2018 Document Reviewed: 04/07/2018 Elsevier Patient Education  2020 [place] self-awareness is knowing how your breasts look and feel. Doing breast self-awareness is important. It allows you to catch a breast problem early while it is still small and can be treated. All women should do breast self-awareness, including women who have had breast implants. Tell your doctor if you notice a change in your breasts. What you need:  A mirror.  A well-lit room. How to do a breast self-exam A breast self-exam is one way to learn what is normal for your breasts and to check for changes. To do a breast self-exam: Look for changes  1. Take off all the clothes above your waist. 2. Stand in front of a mirror in a room with good lighting. 3. Put your hands on your hips. 4. Push your hands down. 5. Look at your breasts and nipples in the mirror to see if one breast or nipple looks  different from the other. Check to see if: ? The shape of one breast is different. ? The size of one breast is different. ? There are wrinkles, dips, and bumps in one breast and not the other. 6. Look at each breast for changes in the skin, such as: ? Redness. ? Scaly areas. 7. Look for changes in your nipples, such as: ? Liquid around the nipples. ? Bleeding. ? Dimpling. ? Redness. ? A change in where the nipples are. Feel for changes  1. Lie on your back on the floor. 2. Feel each breast. To do this, follow these steps: ? Pick a breast to feel. ? Put the arm closest to that breast above your head. ? Use your other arm to feel the nipple area of your breast. Feel the area with the pads of your three middle fingers by making small circles with your fingers. For the first circle, press lightly. For the second circle, press harder. For the third circle, press even harder.  ? Keep making circles with your fingers at the different pressures as you move down your breast. Stop when you feel your ribs. ? Move your fingers a little toward the center of your body. ? Start making circles with your fingers again, this time going up until you reach your collarbone. ? Keep making up-and-down circles until you reach your armpit. Remember to keep using the three pressures. ? Feel the other breast in the same way. 3. Sit or stand in the tub or shower. 4. With soapy water on your skin, feel each breast the same way you did in step 2 when you were lying on the floor. Write down what you find Writing down what you find can help you remember what to tell your doctor. Write down:  What is normal for each breast.  Any changes you find in each breast, including: ? The kind of changes you find. ? Whether you have pain. ? Size and location of any lumps.  When you last had your menstrual period. General tips  Check your breasts every month.  If you are breastfeeding, the best time to check your breasts is after you feed your baby or after you use a breast pump.  If you get menstrual periods, the best time to check your breasts is 5-7 days after your menstrual period is over.  With time, you will become comfortable with the self-exam, and you will begin to know if there are changes in your breasts. Contact a doctor if you:  See a change in the shape or size of your breasts or nipples.  See a change in the skin of your breast or nipples, such as red or scaly skin.  Have fluid coming from your nipples that is not normal.  Find a lump or thick area that was not there before.  Have pain in your breasts.  Have any concerns about your breast health. Summary  Breast self-awareness includes looking for changes in your breasts, as well as feeling for changes within your breasts.  Breast self-awareness should be done in front of a mirror in a well-lit room.  You should  check your breasts every month. If you get menstrual periods, the best time to check your breasts is 5-7 days after your menstrual period is over.  Let your doctor know of any changes you see in your breasts, including changes in size, changes on the skin, pain or tenderness, or fluid from your nipples that is not normal. This  information is not intended to replace advice given to you by your health care provider. Make sure you discuss any questions you have with your health care provider. Document Released: 01/13/2008 Document Revised: 03/15/2018 Document Reviewed: 03/15/2018 Elsevier Patient Education  2020 [employer].

## 2019-06-20 ENCOUNTER — Telehealth: Payer: Self-pay

## 2019-06-20 ENCOUNTER — Other Ambulatory Visit: Payer: Self-pay

## 2019-06-20 MED ORDER — ESTRADIOL 0.1 MG/GM VA CREA: 1.0000 | TOPICAL_CREAM | VAGINAL | 2 refills | Status: DC

## 2019-06-20 NOTE — Telephone Encounter (Signed)
Script resent

## 2019-06-20 NOTE — Telephone Encounter (Signed)
estriadol was written 06/08/19 but not sent to pharmacy. Pt checking when it will be sent to pharmacy on file.

## 2019-06-22 ENCOUNTER — Other Ambulatory Visit: Payer: Self-pay

## 2019-06-22 DIAGNOSIS — Z0000 Encounter for general adult medical examination without abnormal findings: Secondary | ICD-10-CM

## 2019-06-22 NOTE — Addendum Note (Signed)
Addended by: Beckie Salts A on: 06/22/2019 08:01 AM   Modules accepted: Orders

## 2019-06-23 LAB — URINALYSIS, ROUTINE W REFLEX MICROSCOPIC
Bilirubin, UA: NEGATIVE
Glucose, UA: NEGATIVE
Ketones, UA: NEGATIVE
Nitrite, UA: NEGATIVE
Protein,UA: NEGATIVE
RBC, UA: NEGATIVE
Specific Gravity, UA: 1.018 (ref 1.005–1.030)
Urobilinogen, Ur: 0.2 mg/dL (ref 0.2–1.0)
pH, UA: 7 (ref 5.0–7.5)

## 2019-06-23 LAB — MICROSCOPIC EXAMINATION
Casts: NONE SEEN /lpf
WBC, UA: >30 /hpf — AB (ref 0–5)

## 2019-06-23 LAB — COMPREHENSIVE METABOLIC PANEL
ALT: 16 IU/L (ref 0–32)
AST: 21 IU/L (ref 0–40)
Albumin/Globulin Ratio: 2.1 (ref 1.2–2.2)
Albumin: 4.5 g/dL (ref 3.8–4.9)
Alkaline Phosphatase: 92 IU/L (ref 39–117)
BUN/Creatinine Ratio: 22 (ref 9–23)
BUN: 18 mg/dL (ref 6–24)
Bilirubin Total: 0.5 mg/dL (ref 0.0–1.2)
CO2: 24 mmol/L (ref 20–29)
Calcium: 9.6 mg/dL (ref 8.7–10.2)
Chloride: 103 mmol/L (ref 96–106)
Creatinine, Ser: 0.83 mg/dL (ref 0.57–1.00)
GFR calc Af Amer: 89 mL/min/{1.73_m2} (ref >59)
GFR calc non Af Amer: 77 mL/min/{1.73_m2} (ref >59)
Globulin, Total: 2.1 g/dL (ref 1.5–4.5)
Glucose: 96 mg/dL (ref 65–99)
Potassium: 4.1 mmol/L (ref 3.5–5.2)
Sodium: 140 mmol/L (ref 134–144)
Total Protein: 6.6 g/dL (ref 6.0–8.5)

## 2019-06-23 LAB — CBC WITH DIFFERENTIAL/PLATELET
Basophils Absolute: 0 10*3/uL (ref 0.0–0.2)
Basos: 1 %
EOS (ABSOLUTE): 0.2 10*3/uL (ref 0.0–0.4)
Eos: 4 %
Hematocrit: 37.3 % (ref 34.0–46.6)
Hemoglobin: 13.3 g/dL (ref 11.1–15.9)
Immature Grans (Abs): 0 10*3/uL (ref 0.0–0.1)
Immature Granulocytes: 0 %
Lymphocytes Absolute: 1.5 10*3/uL (ref 0.7–3.1)
Lymphs: 36 %
MCH: 32.8 pg (ref 26.6–33.0)
MCHC: 35.7 g/dL (ref 31.5–35.7)
MCV: 92 fL (ref 79–97)
Monocytes Absolute: 0.4 10*3/uL (ref 0.1–0.9)
Monocytes: 9 %
Neutrophils Absolute: 2.1 10*3/uL (ref 1.4–7.0)
Neutrophils: 50 %
Platelets: 242 10*3/uL (ref 150–450)
RBC: 4.05 x10E6/uL (ref 3.77–5.28)
RDW: 11.8 % (ref 11.7–15.4)
WBC: 4.1 10*3/uL (ref 3.4–10.8)

## 2019-06-23 LAB — THYROID PANEL WITH TSH
Free Thyroxine Index: 2.3 (ref 1.2–4.9)
T3 Uptake Ratio: 33 % (ref 24–39)
T4, Total: 6.9 ug/dL (ref 4.5–12.0)
TSH: 1.11 u[IU]/mL (ref 0.450–4.500)

## 2019-06-23 LAB — LIPID PANEL WITH LDL/HDL RATIO
Cholesterol, Total: 195 mg/dL (ref 100–199)
HDL: 79 mg/dL (ref >39)
LDL Chol Calc (NIH): 103 mg/dL — ABNORMAL HIGH (ref 0–99)
LDL/HDL Ratio: 1.3 ratio (ref 0.0–3.2)
Triglycerides: 70 mg/dL (ref 0–149)
VLDL Cholesterol Cal: 13 mg/dL (ref 5–40)

## 2019-06-23 LAB — VITAMIN D 25 HYDROXY (VIT D DEFICIENCY, FRACTURES): Vit D, 25-Hydroxy: 42.5 ng/mL (ref 30.0–100.0)

## 2019-06-23 LAB — VITAMIN B12: Vitamin B-12: 461 pg/mL (ref 232–1245)

## 2019-07-19 ENCOUNTER — Other Ambulatory Visit: Payer: Self-pay

## 2019-07-19 DIAGNOSIS — Z20822 Contact with and (suspected) exposure to covid-19: Secondary | ICD-10-CM

## 2019-07-20 LAB — NOVEL CORONAVIRUS, NAA: SARS-CoV-2, NAA: NOT DETECTED

## 2019-08-07 ENCOUNTER — Other Ambulatory Visit: Payer: Self-pay | Admitting: Certified Nurse Midwife

## 2019-08-07 ENCOUNTER — Telehealth: Payer: Self-pay | Admitting: Obstetrics and Gynecology

## 2019-08-07 MED ORDER — PROGESTERONE MICRONIZED 200 MG PO CAPS: 200.0000 mg | ORAL_CAPSULE | Freq: Every day | ORAL | 5 refills | Status: DC

## 2019-08-07 NOTE — Telephone Encounter (Signed)
Pt called no answer LM via VM and informed that her medication has been refilled and sent to [facility].

## 2019-08-07 NOTE — Telephone Encounter (Signed)
Pt called and stated that at her last visit with MS she did not refill her medication. Pt stated that she has been out of medication for week. Pt stated that she was unsure which provider she would be seeing in the office until after she does her research and then she would make an appointment. Pt stated for now she just wanted a refill of her medication Prometrium 200mg . Pt is aware that her medication refill requested would be sent to a provider. Pt stated that she understood but needed her refill asap.

## 2019-08-07 NOTE — Telephone Encounter (Signed)
Pt has run out of her progesterone (PROMETRIUM) 200 MG capsule [[id number]]  Pt is requesting a refill. Please advise

## 2019-08-07 NOTE — Progress Notes (Signed)
Pt request refill. Orders placed.   Philip Aspen, CNM

## 2019-08-07 NOTE — Telephone Encounter (Signed)
I put the order in.   Thank s Deneise Lever

## 2019-08-09 ENCOUNTER — Telehealth: Payer: Self-pay | Admitting: Certified Nurse Midwife

## 2019-08-09 ENCOUNTER — Other Ambulatory Visit: Payer: Self-pay | Admitting: Certified Nurse Midwife

## 2019-08-09 MED ORDER — PROGESTERONE MICRONIZED 200 MG PO CAPS: 200.0000 mg | ORAL_CAPSULE | Freq: Every day | ORAL | 5 refills | Status: DC

## 2019-08-09 NOTE — Progress Notes (Signed)
Pt request refill for Prometrium. Orders placed.   Philip Aspen, CNM

## 2019-08-09 NOTE — Telephone Encounter (Signed)
Pt called in for another 6 months of progesterone. Had annual 05/2019 with mns. Please advise

## 2019-09-16 ENCOUNTER — Ambulatory Visit: Payer: Self-pay | Attending: Internal Medicine

## 2019-09-16 DIAGNOSIS — Z23 Encounter for immunization: Secondary | ICD-10-CM | POA: Insufficient documentation

## 2019-09-16 NOTE — Progress Notes (Signed)
   Covid-19 Vaccination Clinic  Name:  Phyllis Barnett    MRN: [id number] DOB: 14-Apr-1961  09/16/2019  Phyllis Barnett was observed post Covid-19 immunization for 15 minutes without incidence. She was provided with Vaccine Information Sheet and instruction to access the V-Safe system.   Phyllis Barnett was instructed to call 911 with any severe reactions post vaccine: Marland Kitchen Difficulty breathing  . Swelling of your face and throat  . A fast heartbeat  . A bad rash all over your body  . Dizziness and weakness    Immunizations Administered    Name Date Dose VIS Date Route   Pfizer COVID-19 Vaccine 09/16/2019 12:16 PM 0.3 mL 07/21/2019 Intramuscular   Manufacturer: [employer], [employer]   Lot: [id number]   NDC: [address]-0

## 2019-10-10 ENCOUNTER — Ambulatory Visit: Payer: Self-pay | Attending: Internal Medicine

## 2019-10-10 DIAGNOSIS — Z23 Encounter for immunization: Secondary | ICD-10-CM | POA: Insufficient documentation

## 2019-10-10 NOTE — Progress Notes (Signed)
   Covid-19 Vaccination Clinic  Name:  Phyllis Barnett    MRN: [id number] DOB: 1960-10-26  10/10/2019  [name] was observed post Covid-19 immunization for 15 minutes without incident. She was provided with Vaccine Information Sheet and instruction to access the V-Safe system.   Ms. Gross was instructed to call 911 with any severe reactions post vaccine: Marland Kitchen Difficulty breathing  . Swelling of face and throat  . A fast heartbeat  . A bad rash all over body  . Dizziness and weakness   Immunizations Administered    Name Date Dose VIS Date Route   Pfizer COVID-19 Vaccine 10/10/2019  9:11 AM 0.3 mL 07/21/2019 Intramuscular   Manufacturer: [employer], [employer]   Lot: [id number]   NDC: [address]-7

## 2020-02-26 ENCOUNTER — Other Ambulatory Visit: Payer: Self-pay | Admitting: Certified Nurse Midwife

## 2020-03-29 ENCOUNTER — Ambulatory Visit: Payer: Self-pay

## 2020-03-29 ENCOUNTER — Other Ambulatory Visit: Payer: Self-pay

## 2020-03-29 ENCOUNTER — Telehealth: Payer: Self-pay | Admitting: Registered Nurse

## 2020-03-29 DIAGNOSIS — Z20822 Contact with and (suspected) exposure to covid-19: Secondary | ICD-10-CM

## 2020-03-29 LAB — POC COVID19 BINAXNOW: SARS Coronavirus 2 Ag: NEGATIVE

## 2020-03-29 NOTE — Progress Notes (Signed)
Patient close contact (husband with fever and was exposed to a +covid patient). Patient aware of Negative covid rapid results

## 2020-03-29 NOTE — Progress Notes (Signed)
Subjective:    Patient ID: [employer], female    DOB: 1960/08/30, 59 y.o.   MRN: [id number]  59y/o caucasian female married established fully covid vaccinated patient here for covid testing due to possible exposure.  Spouse with fever 101.5 today and exposure to coworker earlier this week who tested positive.  Spouse has not had testing yet.  Patient reported spouse does not always wear mask in a good way and she is always correcting him to fix his mask when out in community.  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  Daughter due date 03/31/2020. Patient agreed to telehealth visit audio only.  Entire visit conducted via telephone 9 minutes duration.  RN Maisie Fus met patient at her car for POCT covid testing prior to telehealth visit.     Review of Systems  Constitutional: Negative for activity change, appetite change, chills, diaphoresis, fatigue and fever.  HENT: Negative for congestion, postnasal drip, rhinorrhea, sneezing and sore throat.   Respiratory: Negative for cough, shortness of breath, wheezing and stridor.   Gastrointestinal: Negative for diarrhea and vomiting.  Musculoskeletal: Negative for myalgias.  Skin: Negative for rash.  Neurological: Negative for syncope, weakness and headaches.  Psychiatric/Behavioral: Negative for agitation, confusion and sleep disturbance.       Objective:   Physical Exam Nursing note reviewed.  Constitutional:      General: She is awake. She is not in acute distress.    Appearance: Normal appearance. She is well-developed, well-groomed and normal weight. She is not ill-appearing, toxic-appearing or diaphoretic.  HENT:     Head: Normocephalic and atraumatic.     Jaw: There is normal jaw occlusion.     Right Ear: Hearing and external ear normal.     Left Ear: Hearing and external ear normal.     Nose: Nose normal. No congestion or rhinorrhea.     Mouth/Throat:     Lips:  Pink. No lesions.     Mouth: Mucous membranes are moist. No angioedema.     Pharynx: Oropharynx is clear.  Eyes:     General: Lids are normal. Vision grossly intact. Gaze aligned appropriately. No visual field deficit or scleral icterus.       Right eye: No discharge.        Left eye: No discharge.     Extraocular Movements: Extraocular movements intact.     Conjunctiva/sclera: Conjunctivae normal.     Pupils: Pupils are equal, round, and reactive to light.  Neck:     Trachea: Trachea and phonation normal.  Pulmonary:     Effort: Pulmonary effort is normal. No respiratory distress.     Breath sounds: Normal breath sounds and air entry. No stridor or transmitted upper airway sounds. No wheezing.     Comments: Spoke full sentences without difficulty; no cough heard during conversation Abdominal:     General: Abdomen is flat.  Musculoskeletal:        General: Normal range of motion.     Right shoulder: Normal.     Left shoulder: Normal.     Right elbow: Normal.     Left elbow: Normal.     Right hand: Normal.     Left hand: Normal.     Cervical back: Normal and normal range of motion. No edema, erythema, rigidity or torticollis. No pain with movement. Normal range of motion.     Thoracic back: Normal.  Lymphadenopathy:     Cervical:  Right cervical: No superficial cervical adenopathy.    Left cervical: No superficial cervical adenopathy.  Skin:    General: Skin is warm and dry.     Capillary Refill: Capillary refill takes less than 2 seconds.     Coloration: Skin is not ashen, cyanotic, jaundiced, mottled, pale or sallow.     Findings: No bruising, ecchymosis, erythema or rash.     Nails: There is no clubbing.  Neurological:     General: No focal deficit present.     Mental Status: She is alert and oriented to person, place, and time. Mental status is at baseline.     GCS: GCS eye subscore is 4. GCS verbal subscore is 5.     Cranial Nerves: No cranial nerve deficit,  dysarthria or facial asymmetry.     Motor: Motor function is intact. No weakness, tremor, atrophy, abnormal muscle tone or seizure activity.     Coordination: Coordination is intact. Coordination normal.     Comments: Gait not observed as sitting in car in parking lot  Psychiatric:        Attention and Perception: Attention and perception normal.        Mood and Affect: Mood and affect normal.        Speech: Speech normal.        Behavior: Behavior normal. Behavior is cooperative.        Thought Content: Thought content normal.        Cognition and Memory: Cognition and memory normal.        Judgment: Judgment normal.           Assessment & Plan:  A-covid exposure  P-rapid test negative.  Patient notified via telephone of negative test results but discussed that testing is recommended day 3-5 of symptoms if covid symptoms begin and I recommended spouse be tested in 2-4 days since he had exposure greater than 15 minutes with coworker.  I also recommended he quarantine at home until he is tested and for her to consider sleeping separate from him until he is able to be tested as breathrough delta covid infections are occurring in vaccinated individuals.  May return to work, continue to wear mask when out in public and [place] to wear while at work.  Refer to [employer] if further questions regarding covid prevention policies when on [employer].  Monitor for symptoms of covid e.g. runny nose, sore throat, headache, loss of taste/smell, nausea/vomiting/diarrhea, fever/chills, body aches, cough, shortness of breath/dyspnea.  Patient denied any symptoms at this time.  Patient to notify clinic staff if symptoms develop.  Discussed repeat testing may be indicated if symptoms develop.  Discussed current research has shown that breakthrough covid infections are occurring with the delta variant but typically do not require hospitalization for fully vaccinated immunocompetent patients.   Discussed with patient expected 8 month covid booster vaccinatations to start late September 2021 but she will not be due until November with current guidance.  Discussed with patient covid spread and incidence in community high.  Continue to protect self with mask wear, hand sanitizing washing, avoid touching face and maintaining social distancing 6 feet or greater.  Consider isolating from husband.  [employer] handouts on covid FAQ, covid 19 and covid testing.  Patient reported she has active mychart account.  Discussed she can access the handouts through [place].  Patient verbalized understanding of information/instructions, agreed with plan of care and had no further questions at this time.

## 2020-03-29 NOTE — Patient Instructions (Signed)
COVID-19: What Your Test Results Mean If you test positive for COVID-19 Take steps to help prevent the spread of COVID-19 Stay home.  Do not leave your home, except to get medical care. Do not visit public areas. Get rest and stay hydrated. Take over-the-counter medicines, such as acetaminophen, to help you feel better. Stay in touch with your doctor. Separate yourself from other people.  As much as possible, stay in a specific room and away from other people and pets in your home. If you test negative for COVID-19  You probably were not infected at the time your sample was collected.  However, that does not mean you will not get sick.  It is possible that you were very early in your infection when your sample was collected and that you could test positive later. A negative test result does not mean you won't get sick later. [email or web address] 01/07/2019 This information is not intended to replace advice given to you by your health care provider. Make sure you discuss any questions you have with your health care provider. Document Revised: 07/13/2019 Document Reviewed: 07/13/2019 Elsevier Patient Education  2020 Elsevier Inc. COVID-19: How to Protect Yourself and Others Know how it spreads  There is currently no vaccine to prevent coronavirus disease 2019 (COVID-19).  The best way to prevent illness is to avoid being exposed to this virus.  The virus is thought to spread mainly from person-to-person. ? Between people who are in close contact with one another (within about 6 feet). ? Through respiratory droplets produced when an infected person coughs, sneezes or talks. ? These droplets can land in the mouths or noses of people who are nearby or possibly be inhaled into the lungs. ? COVID-19 may be spread by people who are not showing symptoms. Everyone should Clean your hands often  Wash your hands often with soap and water for at least 20 seconds especially after you have  been in a public place, or after blowing your nose, coughing, or sneezing.  If soap and water are not readily available, use a hand sanitizer that contains at least 60% alcohol. Cover all surfaces of your hands and rub them together until they feel dry.  Avoid touching your eyes, nose, and mouth with unwashed hands. Avoid close contact  Limit contact with others as much as possible.  Avoid close contact with people who are sick.  Put distance between yourself and other people. ? Remember that some people without symptoms may be able to spread virus. ? This is especially important for people who are at higher risk of getting very RetroStamps.it Cover your mouth and nose with a mask when around others  You could spread COVID-19 to others even if you do not feel sick.  Everyone should wear a mask in public settings and when around people not living in their household, especially when social distancing is difficult to maintain. ? Masks should not be placed on young children under age 16, anyone who has trouble breathing, or is unconscious, incapacitated or otherwise unable to remove the mask without assistance.  The mask is meant to protect other people in case you are infected.  Do NOT use a facemask meant for a Research scientist (physical sciences).  Continue to keep about 6 feet between yourself and others. The mask is not a substitute for social distancing. Cover coughs and sneezes  Always cover your mouth and nose with a tissue when you cough or sneeze or use the inside of your elbow.  Throw  used tissues in the trash.  Immediately wash your hands with soap and water for at least 20 seconds. If soap and water are not readily available, clean your hands with a hand sanitizer that contains at least 60% alcohol. Clean and disinfect  Clean AND disinfect frequently touched surfaces daily. This includes tables, doorknobs, light  switches, countertops, handles, desks, phones, keyboards, toilets, faucets, and sinks. ktimeonline.com  If surfaces are dirty, clean them: Use detergent or soap and water prior to disinfection.  Then, use a household disinfectant. You can see a list of EPA-registered household disinfectants here. [email or web address] 04/12/2019 This information is not intended to replace advice given to you by your health care provider. Make sure you discuss any questions you have with your health care provider. Document Revised: 04/20/2019 Document Reviewed: 02/16/2019 Elsevier Patient Education  2020 Elsevier Inc. COVID-19: Quarantine vs. Isolation QUARANTINE keeps someone who was in close contact with someone who has COVID-19 away from others. If you had close contact with a person who has COVID-19  Stay home until 14 days after your last contact.  Check your temperature twice a day and watch for symptoms of COVID-19.  If possible, stay away from people who are at higher-risk for getting very sick from COVID-19. ISOLATION keeps someone who is sick or tested positive for COVID-19 without symptoms away from others, even in their own home. If you are sick and think or know you have COVID-19  Stay home until after ? At least 10 days since symptoms first appeared and ? At least 24 hours with no fever without fever-reducing medication and ? Symptoms have improved If you tested positive for COVID-19 but do not have symptoms  Stay home until after ? 10 days have passed since your positive test If you live with others, stay in a specific "sick room" or area and away from other people or animals, including pets. Use a separate bathroom, if available. [email or web address] 02/27/2019 This information is not intended to replace advice given to you by your health care provider. Make sure you discuss any questions you have with your health care  provider. Document Revised: 07/13/2019 Document Reviewed: 07/13/2019 Elsevier Patient Education  2020 [employer]. COVID-19 Frequently Asked Questions COVID-19 (coronavirus disease) is an infection that is caused by a large family of viruses. Some viruses cause illness in people and others cause illness in animals like camels, cats, and bats. In some cases, the viruses that cause illness in animals can spread to humans. Where did the coronavirus come from? In December 2019, [place] told the [facility]) of several cases of lung disease (human respiratory illness). These cases were linked to an open seafood and livestock market in the city of [place]. The link to the seafood and livestock market suggests that the virus may have spread from animals to humans. However, since that first outbreak in December, the virus has also been shown to spread from person to person. What is the name of the disease and the virus? Disease name Early on, this disease was called novel coronavirus. This is because scientists determined that the disease was caused by a new (novel) respiratory virus. The World Health Organization [facility]) has now named the disease COVID-19, or coronavirus disease. Virus name The virus that causes the disease is called severe acute respiratory syndrome coronavirus 2 (SARS-CoV-2). More information on disease and virus naming World Health Organization [facility]): www.who.int/emergencies/diseases/novel-coronavirus-2019/technical-guidance/naming-the-coronavirus-disease-(covid-2019)-and-the-virus-that-causes-it Who is at risk for complications from coronavirus disease? Some people may be at higher risk  for complications from coronavirus disease. This includes older adults and people who have chronic diseases, such as heart disease, diabetes, and lung disease. If you are at higher risk for complications, take these extra precautions:  Stay home as much as possible.  Avoid social  gatherings and travel.  Avoid close contact with others. Stay at least 6 ft (2 m) away from others, if possible.  Wash your hands often with soap and water for at least 20 seconds.  Avoid touching your face, mouth, nose, or eyes.  Keep supplies on hand at home, such as food, medicine, and cleaning supplies.  If you must go out in public, wear a cloth face covering or face mask. Make sure your mask covers your nose and mouth. How does coronavirus disease spread? The virus that causes coronavirus disease spreads easily from person to person (is contagious). You may catch the virus by:  Breathing in droplets from an infected person. Droplets can be spread by a person breathing, speaking, singing, coughing, or sneezing.  Touching something, like a table or a doorknob, that was exposed to the virus (contaminated) and then touching your mouth, nose, or eyes. Can I get the virus from touching surfaces or objects? There is still a lot that we do not know about the virus that causes coronavirus disease. Scientists are basing a lot of information on what they know about similar viruses, such as:  Viruses cannot generally survive on surfaces for long. They need a human body (host) to survive.  It is more likely that the virus is spread by close contact with people who are sick (direct contact), such as through: ? Shaking hands or hugging. ? Breathing in respiratory droplets that travel through the air. Droplets can be spread by a person breathing, speaking, singing, coughing, or sneezing.  It is less likely that the virus is spread when a person touches a surface or object that has the virus on it (indirect contact). The virus may be able to enter the body if the person touches a surface or object and then touches his or her face, eyes, nose, or mouth. Can a person spread the virus without having symptoms of the disease? It may be possible for the virus to spread before a person has symptoms of the  disease, but this is most likely not the main way the virus is spreading. It is more likely for the virus to spread by being in close contact with people who are sick and breathing in the respiratory droplets spread by a person breathing, speaking, singing, coughing, or sneezing. What are the symptoms of coronavirus disease? Symptoms vary from person to person and can range from mild to severe. Symptoms may include:  Fever or chills.  Cough.  Difficulty breathing or feeling short of breath.  Headaches, body aches, or muscle aches.  Runny or stuffy (congested) nose.  Sore throat.  New loss of taste or smell.  Nausea, vomiting, or diarrhea. These symptoms can appear anywhere from 2 to 14 days after you have been exposed to the virus. Some people may not have any symptoms. If you develop symptoms, call your health care provider. People with severe symptoms may need hospital care. Should I be tested for this virus? Your health care provider will decide whether to test you based on your symptoms, history of exposure, and your risk factors. How does a health care provider test for this virus? Health care providers will collect samples to send for testing. Samples may include:  Taking a swab of fluid from the back of your nose and throat, your nose, or your throat.  Taking fluid from the lungs by having you cough up mucus (sputum) into a sterile cup.  Taking a blood sample. Is there a treatment or vaccine for this virus? Currently, there is no vaccine to prevent coronavirus disease. Also, there are no medicines like antibiotics or antivirals to treat the virus. A person who becomes sick is given supportive care, which means rest and fluids. A person may also relieve his or her symptoms by using over-the-counter medicines that treat sneezing, coughing, and runny nose. These are the same medicines that a person takes for the common cold. If you develop symptoms, call your health care provider.  People with severe symptoms may need hospital care. What can I do to protect myself and my family from this virus?     You can protect yourself and your family by taking the same actions that you would take to prevent the spread of other viruses. Take the following actions:  Wash your hands often with soap and water for at least 20 seconds. If soap and water are not available, use alcohol-based hand sanitizer.  Avoid touching your face, mouth, nose, or eyes.  Cough or sneeze into a tissue, sleeve, or elbow. Do not cough or sneeze into your hand or the air. ? If you cough or sneeze into a tissue, throw it away immediately and wash your hands.  Disinfect objects and surfaces that you frequently touch every day.  Stay away from people who are sick.  Avoid going out in public, follow guidance from your state and local health authorities.  Avoid crowded indoor spaces. Stay at least 6 ft (2 m) away from others.  If you must go out in public, wear a cloth face covering or face mask. Make sure your mask covers your nose and mouth.  Stay home if you are sick, except to get medical care. Call your health care provider before you get medical care. Your health care provider will tell you how long to stay home.  Make sure your vaccines are up to date. Ask your health care provider what vaccines you need. What should I do if I need to travel? Follow travel recommendations from your local health authority, the CDC, and WHO. Travel information and advice  Centers for Disease Control and Prevention (CDC): GeminiCard.gl  World Health Organization [facility]): PreviewDomains.se Know the risks and take action to protect your health  You are at higher risk of getting coronavirus disease if you are traveling to areas with an outbreak or if you are exposed to travelers from areas with an outbreak.  Wash your hands  often and practice good hygiene to lower the risk of catching or spreading the virus. What should I do if I am sick? General instructions to stop the spread of infection  Wash your hands often with soap and water for at least 20 seconds. If soap and water are not available, use alcohol-based hand sanitizer.  Cough or sneeze into a tissue, sleeve, or elbow. Do not cough or sneeze into your hand or the air.  If you cough or sneeze into a tissue, throw it away immediately and wash your hands.  Stay home unless you must get medical care. Call your health care provider or local health authority before you get medical care.  Avoid public areas. Do not take public transportation, if possible.  If you can, wear a mask if  you must go out of the house or if you are in close contact with someone who is not sick. Make sure your mask covers your nose and mouth. Keep your home clean  Disinfect objects and surfaces that are frequently touched every day. This may include: ? Counters and tables. ? Doorknobs and light switches. ? Sinks and faucets. ? Electronics such as phones, remote controls, keyboards, computers, and tablets.  Wash dishes in hot, soapy water or use a dishwasher. Air-dry your dishes.  Wash laundry in hot water. Prevent infecting other household members  Let healthy household members care for children and pets, if possible. If you have to care for children or pets, wash your hands often and wear a mask.  Sleep in a different bedroom or bed, if possible.  Do not share personal items, such as razors, toothbrushes, deodorant, combs, brushes, towels, and washcloths. Where to find more information Centers for Disease Control and Prevention (CDC)  Information and news updates: CardRetirement.cz World Health Organization [facility])  Information and news updates: AffordableSalon.es  Coronavirus health topic:  https://thompson-craig.com/  Questions and answers on COVID-19: kruiseway.com  Global tracker: who.sprinklr.com American Academy of Pediatrics (AAP)  Information for families: www.healthychildren.org/English/health-issues/conditions/chest-lungs/Pages/2019-Novel-Coronavirus.aspx The coronavirus situation is changing rapidly. Check your local health authority website or the CDC and [facility] websites for updates and news. When should I contact a health care provider?  Contact your health care provider if you have symptoms of an infection, such as fever or cough, and you: ? Have been near anyone who is known to have coronavirus disease. ? Have come into contact with a person who is suspected to have coronavirus disease. ? Have traveled to an area where there is an outbreak of COVID-19. When should I get emergency medical care?  Get help right away by calling your local emergency services (911 in the [place] if you have: ? Trouble breathing. ? Pain or pressure in your chest. ? Confusion. ? Blue-tinged lips and fingernails. ? Difficulty waking from sleep. ? Symptoms that get worse. Let the emergency medical personnel know if you think you have coronavirus disease. Summary  A new respiratory virus is spreading from person to person and causing COVID-19 (coronavirus disease).  The virus that causes COVID-19 appears to spread easily. It spreads from one person to another through droplets from breathing, speaking, singing, coughing, or sneezing.  Older adults and those with chronic diseases are at higher risk of disease. If you are at higher risk for complications, take extra precautions.  There is currently no vaccine to prevent coronavirus disease. There are no medicines, such as antibiotics or antivirals, to treat the virus.  You can protect yourself and your family by washing your hands often, avoiding touching your face, and covering your coughs  and sneezes. This information is not intended to replace advice given to you by your health care provider. Make sure you discuss any questions you have with your health care provider. Document Revised: 05/26/2019 Document Reviewed: 11/22/2018 Elsevier Patient Education  2020 Elsevier Inc.  COVID-19 COVID-19 is a respiratory infection that is caused by a virus called severe acute respiratory syndrome coronavirus 2 (SARS-CoV-2). The disease is also known as coronavirus disease or novel coronavirus. In some people, the virus may not cause any symptoms. In others, it may cause a serious infection. The infection can get worse quickly and can lead to complications, such as:  Pneumonia, or infection of the lungs.  Acute respiratory distress syndrome or ARDS. This is a condition in  which fluid build-up in the lungs prevents the lungs from filling with air and passing oxygen into the blood.  Acute respiratory failure. This is a condition in which there is not enough oxygen passing from the lungs to the body or when carbon dioxide is not passing from the lungs out of the body.  Sepsis or septic shock. This is a serious bodily reaction to an infection.  Blood clotting problems.  Secondary infections due to bacteria or fungus.  Organ failure. This is when your body's organs stop working. The virus that causes COVID-19 is contagious. This means that it can spread from person to person through droplets from coughs and sneezes (respiratory secretions). What are the causes? This illness is caused by a virus. You may catch the virus by:  Breathing in droplets from an infected person. Droplets can be spread by a person breathing, speaking, singing, coughing, or sneezing.  Touching something, like a table or a doorknob, that was exposed to the virus (contaminated) and then touching your mouth, nose, or eyes. What increases the risk? Risk for infection You are more likely to be infected with this virus if  you:  Are within 6 feet (2 meters) of a person with COVID-19.  Provide care for or live with a person who is infected with COVID-19.  Spend time in crowded indoor spaces or live in shared housing. Risk for serious illness You are more likely to become seriously ill from the virus if you:  Are 22 years of age or older. The higher your age, the more you are at risk for serious illness.  Live in a nursing home or long-term care facility.  Have cancer.  Have a long-term (chronic) disease such as: ? Chronic lung disease, including chronic obstructive pulmonary disease or asthma. ? A long-term disease that lowers your body's ability to fight infection (immunocompromised). ? Heart disease, including heart failure, a condition in which the arteries that lead to the heart become narrow or blocked (coronary artery disease), a disease which makes the heart muscle thick, weak, or stiff (cardiomyopathy). ? Diabetes. ? Chronic kidney disease. ? Sickle cell disease, a condition in which red blood cells have an abnormal "sickle" shape. ? Liver disease.  Are obese. What are the signs or symptoms? Symptoms of this condition can range from mild to severe. Symptoms may appear any time from 2 to 14 days after being exposed to the virus. They include:  A fever or chills.  A cough.  Difficulty breathing.  Headaches, body aches, or muscle aches.  Runny or stuffy (congested) nose.  A sore throat.  New loss of taste or smell. Some people may also have stomach problems, such as nausea, vomiting, or diarrhea. Other people may not have any symptoms of COVID-19. How is this diagnosed? This condition may be diagnosed based on:  Your signs and symptoms, especially if: ? You live in an area with a COVID-19 outbreak. ? You recently traveled to or from an area where the virus is common. ? You provide care for or live with a person who was diagnosed with COVID-19. ? You were exposed to a person who  was diagnosed with COVID-19.  A physical exam.  Lab tests, which may include: ? Taking a sample of fluid from the back of your nose and throat (nasopharyngeal fluid), your nose, or your throat using a swab. ? A sample of mucus from your lungs (sputum). ? Blood tests.  Imaging tests, which may include, X-rays, CT scan,  or ultrasound. How is this treated? At present, there is no medicine to treat COVID-19. Medicines that treat other diseases are being used on a trial basis to see if they are effective against COVID-19. Your health care provider will talk with you about ways to treat your symptoms. For most people, the infection is mild and can be managed at home with rest, fluids, and over-the-counter medicines. Treatment for a serious infection usually takes places in a hospital intensive care unit (ICU). It may include one or more of the following treatments. These treatments are given until your symptoms improve.  Receiving fluids and medicines through an IV.  Supplemental oxygen. Extra oxygen is given through a tube in the nose, a face mask, or a hood.  Positioning you to lie on your stomach (prone position). This makes it easier for oxygen to get into the lungs.  Continuous positive airway pressure (CPAP) or bi-level positive airway pressure (BPAP) machine. This treatment uses mild air pressure to keep the airways open. A tube that is connected to a motor delivers oxygen to the body.  Ventilator. This treatment moves air into and out of the lungs by using a tube that is placed in your windpipe.  Tracheostomy. This is a procedure to create a hole in the neck so that a breathing tube can be inserted.  Extracorporeal membrane oxygenation (ECMO). This procedure gives the lungs a chance to recover by taking over the functions of the heart and lungs. It supplies oxygen to the body and removes carbon dioxide. Follow these instructions at home: Lifestyle  If you are sick, stay home except  to get medical care. Your health care provider will tell you how long to stay home. Call your health care provider before you go for medical care.  Rest at home as told by your health care provider.  Do not use any products that contain nicotine or tobacco, such as cigarettes, e-cigarettes, and chewing tobacco. If you need help quitting, ask your health care provider.  Return to your normal activities as told by your health care provider. Ask your health care provider what activities are safe for you. General instructions  Take over-the-counter and prescription medicines only as told by your health care provider.  Drink enough fluid to keep your urine pale yellow.  Keep all follow-up visits as told by your health care provider. This is important. How is this prevented?  There is no vaccine to help prevent COVID-19 infection. However, there are steps you can take to protect yourself and others from this virus. To protect yourself:   Do not travel to areas where COVID-19 is a risk. The areas where COVID-19 is reported change often. To identify high-risk areas and travel restrictions, check the CDC travel website: StageSync.si  If you live in, or must travel to, an area where COVID-19 is a risk, take precautions to avoid infection. ? Stay away from people who are sick. ? Wash your hands often with soap and water for 20 seconds. If soap and water are not available, use an alcohol-based hand sanitizer. ? Avoid touching your mouth, face, eyes, or nose. ? Avoid going out in public, follow guidance from your state and local health authorities. ? If you must go out in public, wear a cloth face covering or face mask. Make sure your mask covers your nose and mouth. ? Avoid crowded indoor spaces. Stay at least 6 feet (2 meters) away from others. ? Disinfect objects and surfaces that are frequently touched  every day. This may include:  Counters and tables.  Doorknobs and light  switches.  Sinks and faucets.  Electronics, such as phones, remote controls, keyboards, computers, and tablets. To protect others: If you have symptoms of COVID-19, take steps to prevent the virus from spreading to others.  If you think you have a COVID-19 infection, contact your health care provider right away. Tell your health care team that you think you may have a COVID-19 infection.  Stay home. Leave your house only to seek medical care. Do not use public transport.  Do not travel while you are sick.  Wash your hands often with soap and water for 20 seconds. If soap and water are not available, use alcohol-based hand sanitizer.  Stay away from other members of your household. Let healthy household members care for children and pets, if possible. If you have to care for children or pets, wash your hands often and wear a mask. If possible, stay in your own room, separate from others. Use a different bathroom.  Make sure that all people in your household wash their hands well and often.  Cough or sneeze into a tissue or your sleeve or elbow. Do not cough or sneeze into your hand or into the air.  Wear a cloth face covering or face mask. Make sure your mask covers your nose and mouth. Where to find more information  Centers for Disease Control and Prevention: StickerEmporium.tn  World Health Organization: https://thompson-craig.com/ Contact a health care provider if:  You live in or have traveled to an area where COVID-19 is a risk and you have symptoms of the infection.  You have had contact with someone who has COVID-19 and you have symptoms of the infection. Get help right away if:  You have trouble breathing.  You have pain or pressure in your chest.  You have confusion.  You have bluish lips and fingernails.  You have difficulty waking from sleep.  You have symptoms that get worse. These symptoms may represent a serious problem  that is an emergency. Do not wait to see if the symptoms will go away. Get medical help right away. Call your local emergency services (911 in the [place] Do not drive yourself to the hospital. Let the emergency medical personnel know if you think you have COVID-19. Summary  COVID-19 is a respiratory infection that is caused by a virus. It is also known as coronavirus disease or novel coronavirus. It can cause serious infections, such as pneumonia, acute respiratory distress syndrome, acute respiratory failure, or sepsis.  The virus that causes COVID-19 is contagious. This means that it can spread from person to person through droplets from breathing, speaking, singing, coughing, or sneezing.  You are more likely to develop a serious illness if you are 2 years of age or older, have a weak immune system, live in a nursing home, or have chronic disease.  There is no medicine to treat COVID-19. Your health care provider will talk with you about ways to treat your symptoms.  Take steps to protect yourself and others from infection. Wash your hands often and disinfect objects and surfaces that are frequently touched every day. Stay away from people who are sick and wear a mask if you are sick. This information is not intended to replace advice given to you by your health care provider. Make sure you discuss any questions you have with your health care provider. Document Revised: 05/26/2019 Document Reviewed: 09/01/2018 Elsevier Patient Education  2020 Elsevier  Inc.

## 2020-06-25 ENCOUNTER — Other Ambulatory Visit: Payer: Self-pay | Admitting: Obstetrics and Gynecology

## 2020-06-25 DIAGNOSIS — Z1231 Encounter for screening mammogram for malignant neoplasm of breast: Secondary | ICD-10-CM

## 2020-09-10 ENCOUNTER — Ambulatory Visit
Admission: RE | Admit: 2020-09-10 | Discharge: 2020-09-10 | Disposition: A | Discharge status: Home / Self Care | Payer: BC Managed Care – PPO | Source: Ambulatory Visit | Attending: Obstetrics and Gynecology | Admitting: Obstetrics and Gynecology

## 2020-09-10 ENCOUNTER — Other Ambulatory Visit: Payer: Self-pay

## 2020-09-10 DIAGNOSIS — Z1231 Encounter for screening mammogram for malignant neoplasm of breast: Secondary | ICD-10-CM

## 2021-04-29 ENCOUNTER — Ambulatory Visit: Payer: Self-pay | Admitting: Medical

## 2021-04-29 ENCOUNTER — Other Ambulatory Visit: Payer: Self-pay

## 2021-04-29 ENCOUNTER — Encounter: Payer: Self-pay | Admitting: Medical

## 2021-04-29 VITALS — BP 121/78 | HR 68 | Temp 99.2°F | Resp 20 | Ht 66.0 in | Wt 131.8 lb

## 2021-04-29 DIAGNOSIS — J039 Acute tonsillitis, unspecified: Secondary | ICD-10-CM

## 2021-04-29 DIAGNOSIS — J029 Acute pharyngitis, unspecified: Secondary | ICD-10-CM

## 2021-04-29 DIAGNOSIS — B373 Candidiasis of vulva and vagina: Secondary | ICD-10-CM

## 2021-04-29 DIAGNOSIS — B3731 Acute candidiasis of vulva and vagina: Secondary | ICD-10-CM

## 2021-04-29 LAB — POCT RAPID STREP A (OFFICE): Rapid Strep A Screen: NEGATIVE

## 2021-04-29 MED ORDER — AMOXICILLIN-POT CLAVULANATE 875-125 MG PO TABS: 1.0000 | ORAL_TABLET | Freq: Two times a day (BID) | ORAL | 0 refills | Status: DC

## 2021-04-29 MED ORDER — FLUCONAZOLE 150 MG PO TABS: 150.0000 mg | ORAL_TABLET | Freq: Once | ORAL | 0 refills | Status: AC

## 2021-04-29 NOTE — Progress Notes (Signed)
Subjective:    Patient ID: [employer], female    DOB: [birth year], 60 y.o.   MRN: [id number]  HPI 60 yo female in non acute distress presents with ST x 7 days. HA  history of environmental allergies.  Covid Sunday Night negative ( had also tested herself 2 time before Sunday).    Vaccinated  Pfizer and  2 Moderna boosters( mid August was the last injection).  Babysat her granddaughter; this weekend and she had a cold.     Blood pressure 121/78, pulse 68, temperature 99.2 F (37.3 C), temperature source Tympanic, resp. rate 20, height 5\' 6"  (1.676 m), weight 131 lb 12.8 oz (59.8 kg), SpO2 99 %.  Review of Systems  Constitutional:  Positive for chills. Negative for fever.  HENT:  Positive for congestion and sore throat (worse last night). Negative for postnasal drip, rhinorrhea, sinus pressure and sinus pain.   Respiratory:  Positive for cough (very little) and chest tightness (history of heartburn," it feels like that"). Negative for shortness of breath.   Cardiovascular:  Negative for chest pain.  Gastrointestinal:  Negative for abdominal pain and diarrhea.  Allergic/Immunologic: Positive for environmental allergies.  Neurological:  Positive for headaches. Negative for dizziness, syncope and light-headedness.    Allergies  Allergen Reactions   Latex Hives   Other    Tape    Tetracyclines & Related Other (See Comments)    Other Reaction: Blisters on Face       Objective:   Physical Exam Vitals and nursing note reviewed.  Constitutional:      Appearance: Normal appearance.  HENT:     Head: Normocephalic and atraumatic.     Right Ear: Ear canal and external ear normal. A middle ear effusion is present.     Left Ear: Tympanic membrane, ear canal and external ear normal.     Mouth/Throat:     Mouth: Mucous membranes are dry.     Pharynx: Oropharynx is clear. Posterior oropharyngeal erythema present. No oropharyngeal exudate.  Eyes:     Extraocular Movements:  Extraocular movements intact.     Conjunctiva/sclera: Conjunctivae normal.     Pupils: Pupils are equal, round, and reactive to light.  Pulmonary:     Effort: Pulmonary effort is normal.  Musculoskeletal:        General: Normal range of motion.     Cervical back: Normal range of motion and neck supple.  Skin:    General: Skin is warm and dry.  Neurological:     General: No focal deficit present.     Mental Status: She is alert and oriented to person, place, and time. Mental status is at baseline.  Psychiatric:        Mood and Affect: Mood normal.        Behavior: Behavior normal.        Thought Content: Thought content normal.        Judgment: Judgment normal.      STREP TEST NEGATIVE    Assessment & Plan:  Pharyngitis Tonsillitis Yeast vaginitis secondary to antibiotic use. Meds ordered this encounter  Medications   amoxicillin-clavulanate (AUGMENTIN) 875-125 MG tablet    Sig: Take 1 tablet by mouth 2 (two) times daily. Take with food    Dispense:  20 tablet    Refill:  0   fluconazole (DIFLUCAN) 150 MG tablet    Sig: Take 1 tablet (150 mg total) by mouth once for 1 dose. May repeat in  7 days.  Dispense:  2 tablet    Refill:  0   Dilute salt water gargles 3-4 times/day, OTC Motrin or Tylenol per package instructions. Soft food avoid acidic food  like tomatoes or orange juice. Return in 3-5 days if not improving.  Recommended by her PCP to take Pepcid for heartburn howevers , she had been taking Pepto-bismol for heartburn.Explained to patient that Pepcid is better at controlling acid. Patient verbalizes understanding of all instructions and has no questions at discharge.

## 2021-04-29 NOTE — Patient Instructions (Signed)
Amoxicillin; Clavulanic Acid Tablets What is this medication? AMOXICILLIN; CLAVULANIC ACID (a mox i SIL in; KLAV yoo lan ic AS id) treats infections caused by bacteria. It belongs to a group of medications called penicillin antibiotics. It will not treat colds, the flu, or infections caused by viruses. This medicine may be used for other purposes; ask your health care provider or pharmacist if you have questions. COMMON BRAND NAME(S): Augmentin What should I tell my care team before I take this medication? They need to know if you have any of these conditions: Kidney disease Liver disease Mononucleosis Stomach or intestine problems such as colitis An unusual or allergic reaction to amoxicillin, other penicillin or cephalosporin antibiotics, clavulanic acid, other medications, foods, dyes, or preservatives Pregnant or trying to get pregnant Breast-feeding How should I use this medication? Take this medication by mouth. Take it as directed on the prescription label at the same time every day. Take it with food at the start of a meal or snack. Take all of this medication unless your care team tells you to stop it early. Keep taking it even if you think you are better. Talk to your care team about the use of this medication in children. While it may be prescribed for selected conditions, precautions do apply. Overdosage: If you think you have taken too much of this medicine contact a poison control center or emergency room at once. NOTE: This medicine is only for you. Do not share this medicine with others. What if I miss a dose? If you miss a dose, take it as soon as you can. If it is almost time for your next dose, take only that dose. Do not take double or extra doses. What may interact with this medication? Allopurinol Anticoagulants Birth control pills Methotrexate Probenecid This list may not describe all possible interactions. Give your health care provider a list of all the medicines,  herbs, non-prescription drugs, or dietary supplements you use. Also tell them if you smoke, drink alcohol, or use illegal drugs. Some items may interact with your medicine. What should I watch for while using this medication? Tell your care team if your symptoms do not start to get better or if they get worse. This medication may cause serious skin reactions. They can happen weeks to months after starting the medication. Contact your care team right away if you notice fevers or flu-like symptoms with a rash. The rash may be red or purple and then turn into blisters or peeling of the skin. Or, you might notice a red rash with swelling of the face, lips or lymph nodes in your neck or under your arms. Do not treat diarrhea with over the counter products. Contact your care team if you have diarrhea that lasts more than 2 days or if it is severe and watery. If you have diabetes, you may get a false-positive result for sugar in your urine. Check with your care team. Birth control may not work properly while you are taking this medication. Talk to your care team about using an extra method of birth control. What side effects may I notice from receiving this medication? Side effects that you should report to your care team as soon as possible: Allergic reactions-skin rash, itching, hives, swelling of the face, lips, tongue, or throat Liver injury-right upper belly pain, loss of appetite, nausea, light-colored stool, dark yellow or brown urine, yellowing skin or eyes, unusual weakness or fatigue Redness, blistering, peeling, or loosening of the skin, including inside  the mouth Severe diarrhea, fever Unusual vaginal discharge, itching, or odor Side effects that usually do not require medical attention (report to your care team if they continue or are bothersome): Diarrhea Nausea Vomiting This list may not describe all possible side effects. Call your doctor for medical advice about side effects. You may  report side effects to FDA at 1-800-FDA-1088. Where should I keep my medication? Keep out of the reach of children and pets. Store at room temperature between 20 and 25 degrees C (68 and 77 degrees F). Throw away any unused medication after the expiration date. NOTE: This sheet is a summary. It may not cover all possible information. If you have questions about this medicine, talk to your doctor, pharmacist, or health care provider.  2022 Elsevier/Gold Standard (2020-06-19 09:45:03) Tonsillitis Tonsillitis is an infection of the throat. This infection causes the tonsils to become red, tender, and swollen. Tonsils are tissues in the back of your throat. If bacteria caused your infection, antibiotic medicine will be given to you. Sometimes, symptoms of this infection can be treated with the use of medicines that lessen swelling (steroids). If your tonsillitis is very bad (severe) and happens often, you may need to get your tonsils removed (tonsillectomy). Follow these instructions at home: Medicines Take over-the-counter and prescription medicines only as told by your doctor. If you were prescribed an antibiotic, take it as told by your doctor. Do not stop taking the antibiotic even if you start to feel better. Eating and drinking Drink enough fluid to keep your pee (urine) clear or pale yellow. While your throat is sore, eat soft or liquid foods like: Soup. Sherbert. Instant breakfast drinks. Drink warm fluids. Eat frozen ice pops. General instructions Rest as much as possible and get plenty of sleep. Gargle with a salt-water mixture 3-4 times a day or as needed. To make a salt-water mixture, completely dissolve -1 tsp of salt in 1 cup of warm water. Do not swallow salt-water mixture. Wash your hands often with soap and water. If there is no soap and water, use hand sanitizer. Do not share cups, bottles, or other utensils until your symptoms are gone. Do not smoke. If you need help  quitting, ask your doctor. Keep all follow-up visits as told by your doctor. This is important. Contact a doctor if: You have large, tender lumps in your neck. You have a fever that does not go away after 2-3 days. You have a rash. You cough up green, yellow-brown, or bloody fluid. You cannot swallow liquids or food for 24 hours. Only one of your tonsils is swollen. Get help right away if: You have any new symptoms such as: Vomiting. Very bad headache. Stiff neck. Chest pain. Trouble breathing or swallowing. You have very bad throat pain and you also have drooling or voice changes. You have very bad pain that is not helped by medicine. You cannot fully open your mouth. You have redness, swelling, or severe pain anywhere in your neck. Summary Tonsillitis causes your tonsils to be red, tender, and swollen. While your throat is sore, eat soft or liquid foods. Gargle with a salt-water mixture 3-4 times a day or as needed. Do not share cups, bottles, or other utensils until your symptoms are gone. This information is not intended to replace advice given to you by your health care provider. Make sure you discuss any questions you have with your health care provider. Document Revised: 11/25/2020 Document Reviewed: 07/11/2020 Elsevier Patient Education  2022 [employer].  Pharyngitis Pharyngitis is a sore throat (pharynx). This is when there is redness, pain, and swelling in your throat. Most of the time, this condition gets better on its own. In some cases, you may need medicine. What are the causes? An infection from a virus. An infection from bacteria. Allergies. What increases the risk? Being 75-39 years old. Being in crowded environments. These include: Daycares. Schools. Dormitories. Living in a place with cold temperatures outside. Having a weakened disease-fighting (immune) system. What are the signs or symptoms? Symptoms may vary depending on the cause. Common symptoms  include: Sore throat. Tiredness (fatigue). Low-grade fever. Stuffy nose. Cough. Headache. Other symptoms may include: Glands in the neck (lymph nodes) that are swollen. Skin rashes. Film on the throat or tonsils. This can be caused by an infection from bacteria. Vomiting. Red, itchy eyes. Loss of appetite. Joint pain and muscle aches. Tonsils that are temporarily bigger than usual (enlarged). How is this treated? Many times, treatment is not needed. This condition usually gets better in 3-4 days without treatment. If the infection is caused by a bacteria, you may be need to take antibiotics. Follow these instructions at home: Medicines Take over-the-counter and prescription medicines only as told by your doctor. If you were prescribed an antibiotic medicine, take it as told by your doctor. Do not stop taking the antibiotic even if you start to feel better. Use throat lozenges or sprays to soothe your throat as told by your doctor. Children can get pharyngitis. Do not give your child aspirin. Managing pain To help with pain, try: Sipping warm liquids, such as: Broth. Herbal tea. Warm water. Eating or drinking cold or frozen liquids, such as frozen ice pops. Rinsing your mouth (gargle) with a salt water mixture 3-4 times a day or as needed. To make salt water, dissolve -1 tsp (3-6 g) of salt in 1 cup (237 mL) of warm water. Do not swallow this mixture. Sucking on hard candy or throat lozenges. Putting a cool-mist humidifier in your bedroom at night to moisten the air. Sitting in the bathroom with the door closed for 5-10 minutes while you run hot water in the shower.  General instructions  Do not smoke or use any products that contain nicotine or tobacco. If you need help quitting, ask your doctor. Rest as told by your doctor. Drink enough fluid to keep your pee (urine) pale yellow. How is this prevented? Wash your hands often for at least 20 seconds with soap and water.  If soap and water are not available, use hand sanitizer. Do not touch your eyes, nose, or mouth with unwashed hands. Wash hands after touching these areas. Do not share cups or eating utensils. Avoid close contact with people who are sick. Contact a doctor if: You have large, tender lumps in your neck. You have a rash. You cough up green, yellow-brown, or bloody spit. Get help right away if: You have a stiff neck. You drool or cannot swallow liquids. You cannot drink or take medicines without vomiting. You have very bad pain that does not go away with medicine. You have problems breathing, and it is not from a stuffy nose. You have new pain and swelling in your knees, ankles, wrists, or elbows. These symptoms may be an emergency. Get help right away. Call your local emergency services (911 in the [place] Do not wait to see if the symptoms will go away. Do not drive yourself to the hospital. Summary Pharyngitis is a sore throat (pharynx). This  is when there is redness, pain, and swelling in your throat. Most of the time, pharyngitis gets better on its own. Sometimes, you may need medicine. If you were prescribed an antibiotic medicine, take it as told by your doctor. Do not stop taking the antibiotic even if you start to feel better. This information is not intended to replace advice given to you by your health care provider. Make sure you discuss any questions you have with your health care provider. Document Revised: 10/23/2020 Document Reviewed: 10/23/2020 Elsevier Patient Education  2022 [employer].

## 2021-05-13 ENCOUNTER — Other Ambulatory Visit: Payer: Self-pay

## 2021-05-13 ENCOUNTER — Ambulatory Visit: Payer: Self-pay | Admitting: Medical

## 2021-05-13 DIAGNOSIS — H6983 Other specified disorders of Eustachian tube, bilateral: Secondary | ICD-10-CM

## 2021-05-13 DIAGNOSIS — R051 Acute cough: Secondary | ICD-10-CM

## 2021-05-13 DIAGNOSIS — Z1152 Encounter for screening for COVID-19: Secondary | ICD-10-CM

## 2021-05-13 DIAGNOSIS — M791 Myalgia, unspecified site: Secondary | ICD-10-CM

## 2021-05-13 LAB — POC COVID19 BINAXNOW: SARS Coronavirus 2 Ag: NEGATIVE

## 2021-05-13 NOTE — Patient Instructions (Signed)

## 2021-05-13 NOTE — Progress Notes (Signed)
Subjective:    Patient ID: [employer], female    DOB: April 19, 1961, 60 y.o.   MRN: [id number]  HPI 60 yo female in non acute distress.  Exposed by sister on Friday ( she tested positive on Mondayand started feeling bad yesterday about  6 pm. Symptoms include  fatigue , HA, cough, myalgias and diarrhea.  Mild runny nose and post nasal drip.  T 98.1 BP 110/70 Resp 12 HR 83 O2 Sat 97% RA   Review of Systems  Constitutional:  Positive for chills. Negative for fever.  HENT:  Positive for congestion, postnasal drip, rhinorrhea (clear), sore throat, tinnitus and voice change. Negative for ear pain, hearing loss, sinus pressure and sinus pain.   Respiratory:  Positive for cough and wheezing (cough). Negative for chest tightness and shortness of breath.   Cardiovascular:  Negative for chest pain.  Gastrointestinal:  Positive for diarrhea (2 nights ago, still today). Negative for abdominal pain.  Musculoskeletal:  Positive for myalgias.  Skin:  Negative for color change.  Allergic/Immunologic: Positive for environmental allergies.  Neurological:  Positive for headaches. Negative for dizziness, syncope and light-headedness.  Psychiatric/Behavioral:  Dysphoric mood: Myalgias.       Objective:   Physical Exam Vitals and nursing note reviewed.  Constitutional:      Appearance: Normal appearance. She is normal weight.  HENT:     Head: Normocephalic and atraumatic.     Right Ear: Ear canal and external ear normal.     Left Ear: Ear canal and external ear normal.     Mouth/Throat:     Mouth: Mucous membranes are dry.     Pharynx: Oropharynx is clear.  Eyes:     Extraocular Movements: Extraocular movements intact.     Conjunctiva/sclera: Conjunctivae normal.     Pupils: Pupils are equal, round, and reactive to light.  Neurological:     Mental Status: She is alert.         Covid-19 POC Negative PCR Covid-19 Pending. Recent Results (from the past 2160 hour(s))  POCT rapid  strep A     Status: Normal   Collection Time: 04/29/21  1:32 PM  Result Value Ref Range   Rapid Strep A Screen Negative Negative  Novel Coronavirus, NAA ([facility])     Status: Abnormal   Collection Time: 05/13/21  2:55 PM   Specimen: Nasal Swab; Nasopharyngeal(NP) swabs in vial transport medium   Nasopharynge  Previous  Result Value Ref Range   SARS-CoV-2, NAA Detected (A) Not Detected    Comment: Patients who have a positive COVID-19 test result may now have treatment options. Treatment options are available for patients with mild to moderate symptoms and for hospitalized patients. Visit our website at CutFunds.si for resources and information. This nucleic acid amplification test was developed and its performance characteristics determined by [employer]. Nucleic acid amplification tests include RT-PCR and TMA. This test has not been FDA cleared or approved. This test has been authorized by FDA under an Emergency Use Authorization (EUA). This test is only authorized for the duration of time the declaration that circumstances exist justifying the authorization of the emergency use of in vitro diagnostic tests for detection of SARS-CoV-2 virus and/or diagnosis of COVID-19 infection under section 564(b)(1) of the Act, 21 U.S.C. 035KKX-3(G) (1), unless the authorization is terminated or revoked sooner. When diagnostic testing is negativ e, the possibility of a false negative result should be considered in the context of a patient's recent exposures and the presence of  clinical signs and symptoms consistent with COVID-19. An individual without symptoms of COVID-19 and who is not shedding SARS-CoV-2 virus would expect to have a negative (not detected) result in this assay.   SARS-COV-2, NAA 2 DAY TAT     Status: None   Collection Time: 05/13/21  2:55 PM   Nasopharynge  Previous  Result Value Ref Range   SARS-CoV-2, NAA 2 DAY TAT Performed   POC COVID-19      Status: Normal   Collection Time: 05/13/21  2:59 PM  Result Value Ref Range   SARS Coronavirus 2 Ag Negative Negative    Assessment & Plan:  Encounter screening for Covid-19 test Cough  Myalgias ETD She is to call  HR  so they may deem her a close contact. Isolate rest , increase fluids, OTC Motrin or Tylenol per package instrluctions. Follow up with your  PCP if you would like the Paxlovid medication Stay out of work until PCR has resulted and you have talked to a provider. Patient verbalizes understanding and has no questions at discharge.

## 2021-05-14 ENCOUNTER — Encounter: Payer: Self-pay | Admitting: Nurse Practitioner

## 2021-05-14 ENCOUNTER — Telehealth: Payer: Self-pay | Admitting: Nurse Practitioner

## 2021-05-14 ENCOUNTER — Ambulatory Visit: Payer: Self-pay | Admitting: Nurse Practitioner

## 2021-05-14 DIAGNOSIS — U071 COVID-19: Secondary | ICD-10-CM

## 2021-05-14 LAB — SARS-COV-2, NAA 2 DAY TAT

## 2021-05-14 LAB — NOVEL CORONAVIRUS, NAA: SARS-CoV-2, NAA: DETECTED — AB

## 2021-05-14 NOTE — Telephone Encounter (Signed)
Recent Results (from the past 2160 hour(s))  POCT rapid strep A     Status: Normal   Collection Time: 04/29/21  1:32 PM  Result Value Ref Range   Rapid Strep A Screen Negative Negative  Novel Coronavirus, NAA ([facility])     Status: Abnormal   Collection Time: 05/13/21  2:55 PM   Specimen: Nasal Swab; Nasopharyngeal(NP) swabs in vial transport medium   Nasopharynge  Previous  Result Value Ref Range   SARS-CoV-2, NAA Detected (A) Not Detected    Comment: Patients who have a positive COVID-19 test result may now have treatment options. Treatment options are available for patients with mild to moderate symptoms and for hospitalized patients. Visit our website at [email or web address] for resources and information. This nucleic acid amplification test was developed and its performance characteristics determined by [employer]. Nucleic acid amplification tests include RT-PCR and TMA. This test has not been FDA cleared or approved. This test has been authorized by FDA under an Emergency Use Authorization (EUA). This test is only authorized for the duration of time the declaration that circumstances exist justifying the authorization of the emergency use of in vitro diagnostic tests for detection of SARS-CoV-2 virus and/or diagnosis of COVID-19 infection under section 564(b)(1) of the Act, 21 U.S.C. 147WGN-5(A) (1), unless the authorization is terminated or revoked sooner. When diagnostic testing is negativ e, the possibility of a false negative result should be considered in the context of a patient's recent exposures and the presence of clinical signs and symptoms consistent with COVID-19. An individual without symptoms of COVID-19 and who is not shedding SARS-CoV-2 virus would expect to have a negative (not detected) result in this assay.   SARS-COV-2, NAA 2 DAY TAT     Status: None   Collection Time: 05/13/21  2:55 PM   Nasopharynge  Previous  Result Value Ref Range    SARS-CoV-2, NAA 2 DAY TAT Performed   POC COVID-19     Status: Normal   Collection Time: 05/13/21  2:59 PM  Result Value Ref Range   SARS Coronavirus 2 Ag Negative Negative

## 2021-05-14 NOTE — Progress Notes (Signed)
60 year old female - teleheatlh follow up via telephone \\PCR  positive today   No history of COVID infection  Has been vaccinated x4   Denies HTN or DM denies a history of asthma  101.6 temp today, nasal congestion and a cough. Overall feeling better now compared to this am  Symptom onset 4 days ago.   She has been using Advil otc.    Current Outpatient Medications  Medication Instructions   acetaminophen (TYLENOL) 325 MG tablet 1 tablet, Oral, Every 6 hours PRN   calcium carbonate (OSCAL) 1500 (600 Ca) MG TABS tablet Oral, 2 times daily with meals   cholecalciferol (VITAMIN D) 1,000 Units, Oral, Daily   estradiol (ESTRACE VAGINAL) 0.1 MG/GM vaginal cream 1 Applicatorful, Vaginal, 3 times weekly   fluconazole (DIFLUCAN) 150 MG tablet Oral   levothyroxine (SYNTHROID) 75 mcg, Oral, Daily before breakfast   progesterone (PROMETRIUM) 200 MG capsule TAKE 1 CAPSULE BY MOUTH EVERY DAY     Past Medical History:  Diagnosis Date   Vaginal Pap smear, abnormal      1. COVID-19 Discussed anti-viral options, patient will think about that and continue to manage symptoms with over the counter medication as discussed.   Push fluids, rest remain out of work through end of week May return to work Monday 10/10 and should continue to wear a mask through Wednesday 05/21/21  Advised to seek follow up for new or worsening symptoms

## 2021-07-17 ENCOUNTER — Other Ambulatory Visit: Payer: Self-pay | Admitting: Obstetrics and Gynecology

## 2021-07-17 DIAGNOSIS — Z1231 Encounter for screening mammogram for malignant neoplasm of breast: Secondary | ICD-10-CM

## 2021-10-20 ENCOUNTER — Ambulatory Visit
Admission: RE | Admit: 2021-10-20 | Discharge: 2021-10-20 | Disposition: A | Discharge status: Home / Self Care | Payer: BC Managed Care – PPO | Source: Ambulatory Visit | Attending: Obstetrics and Gynecology | Admitting: Obstetrics and Gynecology

## 2021-10-20 ENCOUNTER — Other Ambulatory Visit: Payer: Self-pay

## 2021-10-20 DIAGNOSIS — Z1231 Encounter for screening mammogram for malignant neoplasm of breast: Secondary | ICD-10-CM | POA: Insufficient documentation

## 2022-06-30 ENCOUNTER — Other Ambulatory Visit: Payer: Self-pay | Admitting: Obstetrics and Gynecology

## 2022-06-30 DIAGNOSIS — Z1231 Encounter for screening mammogram for malignant neoplasm of breast: Secondary | ICD-10-CM

## 2022-07-27 ENCOUNTER — Other Ambulatory Visit: Payer: Self-pay

## 2022-07-27 ENCOUNTER — Ambulatory Visit (INDEPENDENT_AMBULATORY_CARE_PROVIDER_SITE_OTHER): Payer: Self-pay | Admitting: Physician Assistant

## 2022-07-27 ENCOUNTER — Encounter: Payer: Self-pay | Admitting: Physician Assistant

## 2022-07-27 VITALS — BP 103/78 | HR 84 | Temp 97.5°F | Wt 131.8 lb

## 2022-07-27 DIAGNOSIS — J069 Acute upper respiratory infection, unspecified: Secondary | ICD-10-CM

## 2022-07-27 NOTE — Progress Notes (Signed)
[facility] [address] [place], [place] [address]   Office Visit Note  Patient Name: Vanice Rappa Coglianese Date of Birth [id number]  Medical Record number [id number]  Date of Service: 07/27/2022  Chief Complaint  Patient presents with   Cough    Sx for about a week, some times dry and sometimes wet, son in law is asmathic and does  not want to risk anything   Nasal Congestion    With loss of voice at times, x 1 week      61 y/o F presents to the clinic for c/o cough and post nasal drainage x 1 week. She denies sore throat, CP, fever, SOB, body aches. Denies sinus pain and pressure. She denies known exposure to covid, flu, or strep. She denies taking any medicines currently for her symptoms.   Cough Associated symptoms include postnasal drip. Pertinent negatives include no sore throat, shortness of breath or wheezing.      Current Medication:  Outpatient Encounter Medications as of 07/27/2022  Medication Sig   calcium carbonate (OSCAL) 1500 (600 Ca) MG TABS tablet Take by mouth 2 (two) times daily with a meal.   escitalopram (LEXAPRO) 10 MG tablet Take 10 mg by mouth daily.   levothyroxine (SYNTHROID, LEVOTHROID) 75 MCG tablet Take 75 mcg by mouth daily before breakfast.   acetaminophen (TYLENOL) 325 MG tablet Take 1 tablet by mouth every 6 (six) hours as needed.   cholecalciferol (VITAMIN D) 1000 UNITS tablet Take 1,000 Units by mouth daily.   [DISCONTINUED] estradiol (ESTRACE VAGINAL) 0.1 MG/GM vaginal cream Place 1 Applicatorful vaginally 3 (three) times a week. (Patient not taking: Reported on 04/29/2021)   [DISCONTINUED] fluconazole (DIFLUCAN) 150 MG tablet Take by mouth. (Patient not taking: Reported on 07/27/2022)   [DISCONTINUED] progesterone (PROMETRIUM) 200 MG capsule TAKE 1 CAPSULE BY MOUTH EVERY DAY (Patient not taking: Reported on 04/29/2021)   No facility-administered encounter medications on file as of 07/27/2022.      Medical History: Past Medical  History:  Diagnosis Date   Vaginal Pap smear, abnormal      Vital Signs: BP 103/78   Pulse 84   Temp (!) 97.5 F (36.4 C) (Tympanic)   Wt 131 lb 12.8 oz (59.8 kg)   SpO2 97%   BMI 21.27 kg/m    Review of Systems  Constitutional: Negative.   HENT:  Positive for congestion and postnasal drip. Negative for sinus pressure, sinus pain, sore throat and trouble swallowing.   Respiratory:  Positive for cough. Negative for chest tightness, shortness of breath and wheezing.   Cardiovascular: Negative.   Neurological: Negative.     Physical Exam Constitutional:      Appearance: Normal appearance.  HENT:     Head: Atraumatic.     Right Ear: Tympanic membrane, ear canal and external ear normal.     Left Ear: Tympanic membrane, ear canal and external ear normal.     Nose: Nose normal.     Mouth/Throat:     Mouth: Mucous membranes are moist.     Pharynx: Oropharynx is clear.  Eyes:     Extraocular Movements: Extraocular movements intact.  Cardiovascular:     Rate and Rhythm: Normal rate and regular rhythm.  Pulmonary:     Effort: Pulmonary effort is normal.     Breath sounds: Normal breath sounds.  Musculoskeletal:     Cervical back: Neck supple.  Skin:    General: Skin is warm.  Neurological:     Mental  Status: She is alert.  Psychiatric:        Mood and Affect: Mood normal.        Behavior: Behavior normal.        Thought Content: Thought content normal.        Judgment: Judgment normal.       Assessment/Plan:  1. URI with cough and congestion  Increase fluids Take otc cough suppressants as directed on the bottle. Consider otc oral decongestant ie Sudafed as directed on the box.  Continue to watch for worsening symptoms RTC prn Pt verbalized understanding and in agreement.   General Counseling: Sonnet verbalizes understanding of the findings of todays visit and agrees with plan of treatment. I have discussed any further diagnostic evaluation that may be  needed or ordered today. We also reviewed her medications today. she has been encouraged to call the office with any questions or concerns that should arise related to todays visit.    Time spent:20 [place], [place] Physician Assistant

## 2022-10-27 ENCOUNTER — Ambulatory Visit
Admission: RE | Admit: 2022-10-27 | Discharge: 2022-10-27 | Disposition: A | Discharge status: Home / Self Care | Payer: BC Managed Care – PPO | Source: Ambulatory Visit | Attending: Obstetrics and Gynecology | Admitting: Obstetrics and Gynecology

## 2022-10-27 DIAGNOSIS — Z1231 Encounter for screening mammogram for malignant neoplasm of breast: Secondary | ICD-10-CM | POA: Diagnosis not present

## 2023-08-09 ENCOUNTER — Other Ambulatory Visit: Payer: Self-pay | Admitting: Obstetrics and Gynecology

## 2023-08-09 DIAGNOSIS — Z1231 Encounter for screening mammogram for malignant neoplasm of breast: Secondary | ICD-10-CM

## 2023-10-28 ENCOUNTER — Ambulatory Visit
Admission: RE | Admit: 2023-10-28 | Discharge: 2023-10-28 | Disposition: A | Discharge status: Home / Self Care | Payer: BC Managed Care – PPO | Source: Ambulatory Visit | Attending: Obstetrics and Gynecology | Admitting: Obstetrics and Gynecology

## 2023-10-28 DIAGNOSIS — Z1231 Encounter for screening mammogram for malignant neoplasm of breast: Secondary | ICD-10-CM | POA: Diagnosis present

## 2023-11-04 ENCOUNTER — Ambulatory Visit: Payer: Self-pay

## 2023-11-04 NOTE — Progress Notes (Signed)
 Nutrition: 11/04/2023  CC: " I have a terrible diet.  I change eating habits month to month.  I have been diagnosed with seasonal affective disorder.  The months that I am bothered by it, I eat very little and I lose weight.  During the months that I am not affected, I feel better and eat more.  At times, I feel like I am starving and can't get filled up."  Assessment:  HT: 5' 6.5"   WT: 133 lbs  BMI: 21.14 For her, her ideal weight is 129 lbs where her clothes fit the best. And prior to her Seasonal Illness, she was a usual 129 lbs. During the period following the Seasonal Affected periods, she relates that he feels good and will eat all the time.  She will go from the 125 lbs of the depression state to a high of 133 - 140 lbs.  The increase is uncomfortable for her. The fluctuations have been going on for the last 3 years.  The affective periods las sometimes from September to January.  This year it was shorter and limited to the months of November and December that she lost weight.  Activity: She walks daily 2-3 mils with her neighbor.  She walks about campus, practices meditation, and yoga.  On weekends will go with her husband to the gym and use the treadmill.  Dietary: Early AM will take thyroid medication and wait 60 minutes for food or drink. Then will have a cup of green tea. Followed by a glass of water with added electrolytes, a smoothie or power bar. 10:00 am will have a power bar or a muffin or doughnut or some dark chocolate with a cup of green tea.  Tries to avoid the sweet pastries that are often present in the office. Lunch: Geraldina Parrott be a wrap with processed meat from the cafe on campus or a sandwich of bologna and mayo on white bread from home. Snack of apples or dark chocolate Dinner Deroy Noah be a meat and salad or lentil soup with bread.  Husband does cooking from scratch and dinner is often after 8:00 pm. Snack often follows dinner. Usually it is  mint chocolate chip ice cream.  Often she  is hungry and feels like she could eat 10 bowls of the ice cream instead of one.  Recommendations: At the evening meal, try to have a complex carbohydrate like a whole grain, beans or peas  to accompany your meat and vegetable. Have emergency servings of low sugar, dark chocolate for snacking. Try to avoid the muffins and use only those muffins that are whole grain and have limited sugar and fruity ingredients. Increase your PB or protein powder in your smoothie. Try to always do whole grains when possible.  Teaching Handouts: Foods and Food Groups Foods and Food Groups for [employer] Food Label handout  Follow-up: Try to follow-up in 4-8 weeks.  Maggie Shawnmichael Parenteau, RN, RD, LDN
